# Patient Record
Sex: Male | Born: 1965 | Race: White | Hispanic: No | Marital: Married | Smoking: Never smoker
Health system: Southern US, Community
[De-identification: ages and names within clinical notes are randomized; demographics above are authoritative.]

## PROBLEM LIST (undated history)

## (undated) DIAGNOSIS — F419 Anxiety disorder, unspecified: Secondary | ICD-10-CM

## (undated) DIAGNOSIS — R030 Elevated blood-pressure reading, without diagnosis of hypertension: Secondary | ICD-10-CM

## (undated) DIAGNOSIS — I10 Essential (primary) hypertension: Secondary | ICD-10-CM

## (undated) DIAGNOSIS — M5416 Radiculopathy, lumbar region: Secondary | ICD-10-CM

## (undated) DIAGNOSIS — I639 Cerebral infarction, unspecified: Secondary | ICD-10-CM

## (undated) DIAGNOSIS — M48061 Spinal stenosis, lumbar region without neurogenic claudication: Secondary | ICD-10-CM

## (undated) DIAGNOSIS — F32A Depression, unspecified: Secondary | ICD-10-CM

## (undated) DIAGNOSIS — M549 Dorsalgia, unspecified: Secondary | ICD-10-CM

## (undated) HISTORY — DX: Depression, unspecified: F32.A

## (undated) HISTORY — DX: Cerebral infarction, unspecified: I63.9

## (undated) HISTORY — PX: OTHER SURGICAL HISTORY: SHX169

## (undated) HISTORY — DX: Anxiety disorder, unspecified: F41.9

## (undated) HISTORY — PX: HERNIA REPAIR: SHX51

## (undated) HISTORY — DX: Essential (primary) hypertension: I10

## (undated) HISTORY — PX: SPINE SURGERY: SHX786

---

## 2005-07-31 ENCOUNTER — Inpatient Hospital Stay: Payer: Self-pay | Admitting: Internal Medicine

## 2021-06-05 ENCOUNTER — Other Ambulatory Visit: Payer: Self-pay | Admitting: Chiropractic Medicine

## 2021-06-05 ENCOUNTER — Other Ambulatory Visit (HOSPITAL_COMMUNITY): Payer: Self-pay | Admitting: Chiropractic Medicine

## 2021-06-05 DIAGNOSIS — M5442 Lumbago with sciatica, left side: Secondary | ICD-10-CM

## 2021-06-05 DIAGNOSIS — M533 Sacrococcygeal disorders, not elsewhere classified: Secondary | ICD-10-CM

## 2021-06-05 DIAGNOSIS — M489 Spondylopathy, unspecified: Secondary | ICD-10-CM

## 2021-06-14 ENCOUNTER — Ambulatory Visit
Admission: RE | Admit: 2021-06-14 | Discharge: 2021-06-14 | Disposition: A | Discharge status: Home / Self Care | Payer: Commercial Managed Care - PPO | Source: Ambulatory Visit | Attending: Chiropractic Medicine | Admitting: Chiropractic Medicine

## 2021-06-14 ENCOUNTER — Other Ambulatory Visit: Payer: Self-pay

## 2021-06-14 DIAGNOSIS — M5442 Lumbago with sciatica, left side: Secondary | ICD-10-CM | POA: Insufficient documentation

## 2021-06-14 DIAGNOSIS — M489 Spondylopathy, unspecified: Secondary | ICD-10-CM | POA: Insufficient documentation

## 2021-06-14 DIAGNOSIS — M533 Sacrococcygeal disorders, not elsewhere classified: Secondary | ICD-10-CM | POA: Insufficient documentation

## 2021-07-05 ENCOUNTER — Other Ambulatory Visit: Payer: Self-pay | Admitting: Neurosurgery

## 2021-07-11 ENCOUNTER — Other Ambulatory Visit: Payer: Self-pay

## 2021-07-11 ENCOUNTER — Other Ambulatory Visit: Payer: Self-pay | Admitting: Neurosurgery

## 2021-07-11 ENCOUNTER — Ambulatory Visit
Admission: RE | Admit: 2021-07-11 | Discharge: 2021-07-11 | Disposition: A | Discharge status: Home / Self Care | Payer: Commercial Managed Care - PPO | Source: Ambulatory Visit | Attending: Neurosurgery | Admitting: Neurosurgery

## 2021-07-11 ENCOUNTER — Encounter
Admission: RE | Admit: 2021-07-11 | Discharge: 2021-07-11 | Disposition: A | Discharge status: Home / Self Care | Payer: Commercial Managed Care - PPO | Source: Ambulatory Visit | Attending: Neurosurgery | Admitting: Neurosurgery

## 2021-07-11 DIAGNOSIS — R292 Abnormal reflex: Secondary | ICD-10-CM | POA: Insufficient documentation

## 2021-07-11 DIAGNOSIS — R2689 Other abnormalities of gait and mobility: Secondary | ICD-10-CM | POA: Diagnosis present

## 2021-07-11 LAB — BASIC METABOLIC PANEL
Anion gap: 8 (ref 5–15)
BUN: 17 mg/dL (ref 6–20)
CO2: 25 mmol/L (ref 22–32)
Calcium: 9.8 mg/dL (ref 8.9–10.3)
Chloride: 104 mmol/L (ref 98–111)
Creatinine, Ser: 0.85 mg/dL (ref 0.61–1.24)
GFR, Estimated: >60 mL/min (ref >60)
Glucose, Bld: 150 mg/dL — ABNORMAL HIGH (ref 70–99)
Potassium: 4.2 mmol/L (ref 3.5–5.1)
Sodium: 137 mmol/L (ref 135–145)

## 2021-07-11 LAB — CBC
HCT: 47.4 % (ref 39.0–52.0)
Hemoglobin: 16.4 g/dL (ref 13.0–17.0)
MCH: 32 pg (ref 26.0–34.0)
MCHC: 34.6 g/dL (ref 30.0–36.0)
MCV: 92.4 fL (ref 80.0–100.0)
Platelets: 282 10*3/uL (ref 150–400)
RBC: 5.13 MIL/uL (ref 4.22–5.81)
RDW: 12.8 % (ref 11.5–15.5)
WBC: 6.2 10*3/uL (ref 4.0–10.5)
nRBC: 0 % (ref 0.0–0.2)

## 2021-07-11 LAB — URINALYSIS, ROUTINE W REFLEX MICROSCOPIC
Bacteria, UA: NONE SEEN
Bilirubin Urine: NEGATIVE
Glucose, UA: 50 mg/dL — AB
Ketones, ur: NEGATIVE mg/dL
Leukocytes,Ua: NEGATIVE
Nitrite: NEGATIVE
Protein, ur: NEGATIVE mg/dL
Specific Gravity, Urine: 1.013 (ref 1.005–1.030)
Squamous Epithelial / HPF: NONE SEEN (ref 0–5)
pH: 5 (ref 5.0–8.0)

## 2021-07-11 LAB — TYPE AND SCREEN
ABO/RH(D): A POS
Antibody Screen: NEGATIVE

## 2021-07-11 LAB — SURGICAL PCR SCREEN
MRSA, PCR: NEGATIVE
Staphylococcus aureus: NEGATIVE

## 2021-07-11 LAB — PROTIME-INR
INR: 1 (ref 0.8–1.2)
Prothrombin Time: 13.3 seconds (ref 11.4–15.2)

## 2021-07-11 LAB — APTT: aPTT: 29 seconds (ref 24–36)

## 2021-07-11 NOTE — Patient Instructions (Addendum)
?Your procedure is scheduled on: Wednesday July 18, 2021. ?Report to Day Surgery inside [facility] 2nd floor, stop by admissions desk before getting on elevator. ?To find out your arrival time please call [phone] between 1PM - 3PM on Tuesday July 17, 2021. ? ?Remember: Instructions that are not followed completely may result in serious medical risk,  ?up to and including death, or upon the discretion of your surgeon and anesthesiologist your  ?surgery may need to be rescheduled.  ? ?  _X__ 1. Do not eat food after midnight the night before your procedure. ?                No chewing gum or hard candies. You may drink clear liquids up to 2 hours ?                before you are scheduled to arrive for your surgery- DO not drink clear ?                liquids within 2 hours of the start of your surgery. ?                Clear Liquids include:  water, apple juice without pulp, clear Gatorade, G2 or  ?                Gatorade Zero (avoid Red/Purple/Blue), Black Coffee or Tea (Do not add ?                anything to coffee or tea). ? ?__X__2.  On the morning of surgery brush your teeth with toothpaste and water, you ?               may rinse your mouth with mouthwash if you wish.  Do not swallow any toothpaste or mouthwash. ?   ? _X__ 3.  No Alcohol for 24 hours before or after surgery. ? ? _X__ 4.  Do Not Smoke or use e-cigarettes For 24 Hours Prior to Your Surgery. ?                Do not use any chewable tobacco products for at least 6 hours prior to ?                Surgery. ? ?_X__  5.  Do not use any recreational drugs (marijuana, cocaine, heroin, ecstasy, MDMA or other) ?               For at least one week prior to your surgery.  Combination of these drugs with anesthesia ?               May have life threatening results. ? ?____  6.  Bring all medications with you on the day of surgery if instructed.  ? ?__X__  7.  Notify your doctor if there is any change in your medical  condition  ?    (cold, fever, infections). ?    ?Do not wear jewelry, make-up, hairpins, clips or nail polish. ?Do not wear lotions, powders, or perfumes. You may wear deodorant. ?Do not shave 48 hours prior to surgery. Men may shave face and neck. ?Do not bring valuables to the hospital.   ? ?[facility] is not responsible for any belongings or valuables. ? ?Contacts, dentures or bridgework may not be worn into surgery. ?Leave your suitcase in the car. After surgery it may be brought to your room. ?For patients admitted to the hospital, discharge time is determined  by your ?treatment team. ?  ?Patients discharged the day of surgery will not be allowed to drive home.   ?Make arrangements for someone to be with you for the first 24 hours of your ?Same Day Discharge. ? ?  ?Please read over the following fact sheets that you were given:  ? Spine Surgery   ? ? ?__X__ Take these medicines the morning of surgery with A SIP OF WATER:  ? ? 1. None ? 2.  ? 3.  ? 4. ? 5. ? 6. ? ?____ Fleet Enema (as directed)  ? ?__X__ Use CHG Soap (or wipes) as directed ? ?____ Use Benzoyl Peroxide Gel as instructed ? ?____ Use inhalers on the day of surgery ? ?____ Stop metformin 2 days prior to surgery   ? ?____ Take 1/2 of usual insulin dose the night before surgery. No insulin the morning ?         of surgery.  ? ?____ Call your PCP, cardiologist, or Pulmonologist if taking Coumadin/Plavix/aspirin and ask when to stop before your surgery.  ? ?__X__ One Week prior to surgery- Stop Anti-inflammatories such as Ibuprofen, Aleve, Advil, Motrin, meloxicam (MOBIC), diclofenac, etodolac, ketorolac, Toradol, Daypro, piroxicam, Goody's or BC powders. OK TO USE TYLENOL IF NEEDED ?  ?__X__ Stop supplements until after surgery.   ? ?____ Bring C-Pap to the hospital.  ? ? ?If you have any questions regarding your pre-procedure instructions,  ?Please call Pre-admit Testing at [phone] ?

## 2021-07-17 MED ORDER — CEFAZOLIN SODIUM-DEXTROSE 2-4 GM/100ML-% IV SOLN
2.0000 g | INTRAVENOUS | Status: AC
  Administered 2021-07-18: 2 g via INTRAVENOUS

## 2021-07-17 MED ORDER — FAMOTIDINE 20 MG PO TABS: 20.0000 mg | ORAL_TABLET | Freq: Once | ORAL | Status: AC

## 2021-07-17 MED ORDER — LACTATED RINGERS IV SOLN: INTRAVENOUS | Status: DC

## 2021-07-17 MED ORDER — CHLORHEXIDINE GLUCONATE 0.12 % MT SOLN: 15.0000 mL | Freq: Once | OROMUCOSAL | Status: AC

## 2021-07-17 MED ORDER — ORAL CARE MOUTH RINSE
15.0000 mL | Freq: Once | OROMUCOSAL | Status: AC
Start: 2021-07-17 — End: 2021-07-18

## 2021-07-17 NOTE — Progress Notes (Signed)
Pharmacy Antibiotic Note ? ?JORIEL DICHTER is a 56 y.o. male admitted on (Not on file) with surgical prophylaxis.  Pharmacy has been consulted for Cefazolin dosing. ? ?Plan: ?Cefazolin 2 gm IV X 1 60 min pre-op ordered for 3/29 @ 0500.  ? ?  ? ?No data recorded. ? ?Recent Labs  ?Lab 07/11/21 ?0910  ?WBC 6.2  ?CREATININE 0.85  ?  ?Estimated Creatinine Clearance: 114.9 mL/min (by C-G formula based on SCr of 0.85 mg/dL).   ? ?No Known Allergies ? ?Antimicrobials this admission: ?  >>  ?  >>  ? ?Dose adjustments this admission: ? ? ?Microbiology results: ? BCx:  ? UCx:   ? Sputum:   ? MRSA PCR:  ? ?Thank you for allowing pharmacy to be a part of this patient?s care. ? ?Floris Neuhaus D ?07/17/2021 10:10 PM ? ?

## 2021-07-18 ENCOUNTER — Other Ambulatory Visit: Payer: Self-pay

## 2021-07-18 ENCOUNTER — Encounter: Payer: Self-pay | Admitting: Neurosurgery

## 2021-07-18 ENCOUNTER — Ambulatory Visit: Payer: Commercial Managed Care - PPO | Admitting: Urgent Care

## 2021-07-18 ENCOUNTER — Encounter
Admission: RE | Disposition: A | Discharge status: Home / Self Care | Payer: Self-pay | Source: Home / Self Care | Attending: Internal Medicine

## 2021-07-18 ENCOUNTER — Observation Stay
Admission: RE | Admit: 2021-07-18 | Discharge: 2021-07-19 | Disposition: A | Discharge status: Home / Self Care | Payer: Commercial Managed Care - PPO | Attending: Internal Medicine | Admitting: Internal Medicine

## 2021-07-18 ENCOUNTER — Ambulatory Visit: Payer: Commercial Managed Care - PPO

## 2021-07-18 DIAGNOSIS — M48061 Spinal stenosis, lumbar region without neurogenic claudication: Secondary | ICD-10-CM | POA: Diagnosis not present

## 2021-07-18 DIAGNOSIS — M48062 Spinal stenosis, lumbar region with neurogenic claudication: Principal | ICD-10-CM | POA: Insufficient documentation

## 2021-07-18 DIAGNOSIS — I160 Hypertensive urgency: Secondary | ICD-10-CM

## 2021-07-18 DIAGNOSIS — M5416 Radiculopathy, lumbar region: Secondary | ICD-10-CM | POA: Diagnosis not present

## 2021-07-18 DIAGNOSIS — Z79899 Other long term (current) drug therapy: Secondary | ICD-10-CM | POA: Diagnosis not present

## 2021-07-18 HISTORY — DX: Spinal stenosis, lumbar region without neurogenic claudication: M48.061

## 2021-07-18 HISTORY — DX: Dorsalgia, unspecified: M54.9

## 2021-07-18 HISTORY — DX: Radiculopathy, lumbar region: M54.16

## 2021-07-18 HISTORY — PX: LUMBAR LAMINECTOMY/DECOMPRESSION MICRODISCECTOMY: SHX5026

## 2021-07-18 LAB — CBC
HCT: 46.1 % (ref 39.0–52.0)
Hemoglobin: 15.8 g/dL (ref 13.0–17.0)
MCH: 31.9 pg (ref 26.0–34.0)
MCHC: 34.3 g/dL (ref 30.0–36.0)
MCV: 92.9 fL (ref 80.0–100.0)
Platelets: 251 10*3/uL (ref 150–400)
RBC: 4.96 MIL/uL (ref 4.22–5.81)
RDW: 12.9 % (ref 11.5–15.5)
WBC: 9.4 10*3/uL (ref 4.0–10.5)
nRBC: 0 % (ref 0.0–0.2)

## 2021-07-18 LAB — BASIC METABOLIC PANEL
Anion gap: 11 (ref 5–15)
BUN: 19 mg/dL (ref 6–20)
CO2: 25 mmol/L (ref 22–32)
Calcium: 8.4 mg/dL — ABNORMAL LOW (ref 8.9–10.3)
Chloride: 99 mmol/L (ref 98–111)
Creatinine, Ser: 1.12 mg/dL (ref 0.61–1.24)
GFR, Estimated: >60 mL/min (ref >60)
Glucose, Bld: 232 mg/dL — ABNORMAL HIGH (ref 70–99)
Potassium: 3.6 mmol/L (ref 3.5–5.1)
Sodium: 135 mmol/L (ref 135–145)

## 2021-07-18 LAB — TROPONIN I (HIGH SENSITIVITY)
Troponin I (High Sensitivity): 5 ng/L (ref <18)
Troponin I (High Sensitivity): 5 ng/L (ref <18)

## 2021-07-18 LAB — ABO/RH: ABO/RH(D): A POS

## 2021-07-18 LAB — URINE DRUG SCREEN, QUALITATIVE (ARMC ONLY)
Amphetamines, Ur Screen: NOT DETECTED
Barbiturates, Ur Screen: NOT DETECTED
Benzodiazepine, Ur Scrn: POSITIVE — AB
Cannabinoid 50 Ng, Ur ~~LOC~~: NOT DETECTED
Cocaine Metabolite,Ur ~~LOC~~: NOT DETECTED
MDMA (Ecstasy)Ur Screen: NOT DETECTED
Methadone Scn, Ur: NOT DETECTED
Opiate, Ur Screen: NOT DETECTED
Phencyclidine (PCP) Ur S: NOT DETECTED
Tricyclic, Ur Screen: NOT DETECTED

## 2021-07-18 LAB — HIV ANTIBODY (ROUTINE TESTING W REFLEX): HIV Screen 4th Generation wRfx: NONREACTIVE

## 2021-07-18 SURGERY — LUMBAR LAMINECTOMY/DECOMPRESSION MICRODISCECTOMY 2 LEVELS
Anesthesia: General | Site: Back

## 2021-07-18 MED ORDER — SODIUM CHLORIDE FLUSH 0.9 % IV SOLN
INTRAVENOUS | Status: AC
  Filled 2021-07-18: qty 20

## 2021-07-18 MED ORDER — METOPROLOL TARTRATE 5 MG/5ML IV SOLN
2.0000 mg | Freq: Once | INTRAVENOUS | Status: AC
  Administered 2021-07-18: 2 mg via INTRAVENOUS

## 2021-07-18 MED ORDER — SUCCINYLCHOLINE CHLORIDE 200 MG/10ML IV SOSY
PREFILLED_SYRINGE | INTRAVENOUS | Status: DC | PRN
Start: 2021-07-18 — End: 2021-07-18
  Administered 2021-07-18: 100 mg via INTRAVENOUS

## 2021-07-18 MED ORDER — ONDANSETRON HCL 4 MG/2ML IJ SOLN
INTRAMUSCULAR | Status: DC | PRN
  Administered 2021-07-18: 4 mg via INTRAVENOUS

## 2021-07-18 MED ORDER — METOPROLOL TARTRATE 5 MG/5ML IV SOLN
INTRAVENOUS | Status: AC
  Administered 2021-07-18: 2 mg via INTRAVENOUS
  Filled 2021-07-18: qty 5

## 2021-07-18 MED ORDER — AMLODIPINE BESYLATE 5 MG PO TABS
5.0000 mg | ORAL_TABLET | Freq: Every day | ORAL | Status: DC
Start: 2021-07-18 — End: 2021-07-19
  Filled 2021-07-18: qty 1

## 2021-07-18 MED ORDER — PROPOFOL 1000 MG/100ML IV EMUL
INTRAVENOUS | Status: AC
  Filled 2021-07-18: qty 100

## 2021-07-18 MED ORDER — OXYCODONE HCL 5 MG PO TABS
5.0000 mg | ORAL_TABLET | Freq: Three times a day (TID) | ORAL | Status: DC | PRN
  Administered 2021-07-19: 5 mg via ORAL
  Filled 2021-07-18: qty 1

## 2021-07-18 MED ORDER — REMIFENTANIL HCL 1 MG IV SOLR
INTRAVENOUS | Status: AC
  Filled 2021-07-18: qty 1000

## 2021-07-18 MED ORDER — ACETAMINOPHEN 10 MG/ML IV SOLN
INTRAVENOUS | Status: AC
  Filled 2021-07-18: qty 100

## 2021-07-18 MED ORDER — PHENYLEPHRINE HCL-NACL 20-0.9 MG/250ML-% IV SOLN
INTRAVENOUS | Status: DC | PRN
  Administered 2021-07-18: 20 ug/min via INTRAVENOUS

## 2021-07-18 MED ORDER — FENTANYL CITRATE (PF) 100 MCG/2ML IJ SOLN
INTRAMUSCULAR | Status: DC | PRN
  Administered 2021-07-18: 100 ug via INTRAVENOUS

## 2021-07-18 MED ORDER — NITROGLYCERIN 2 % TD OINT
TOPICAL_OINTMENT | TRANSDERMAL | Status: AC
  Administered 2021-07-18: 1 [in_us] via TOPICAL
  Filled 2021-07-18: qty 1

## 2021-07-18 MED ORDER — PHENYLEPHRINE 40 MCG/ML (10ML) SYRINGE FOR IV PUSH (FOR BLOOD PRESSURE SUPPORT)
PREFILLED_SYRINGE | INTRAVENOUS | Status: DC | PRN
  Administered 2021-07-18: 40 ug via INTRAVENOUS

## 2021-07-18 MED ORDER — LIDOCAINE HCL (PF) 2 % IJ SOLN
INTRAMUSCULAR | Status: AC
  Filled 2021-07-18: qty 5

## 2021-07-18 MED ORDER — PHENYLEPHRINE HCL (PRESSORS) 10 MG/ML IV SOLN
INTRAVENOUS | Status: AC
  Filled 2021-07-18: qty 1

## 2021-07-18 MED ORDER — PROPOFOL 10 MG/ML IV BOLUS
INTRAVENOUS | Status: DC | PRN
  Administered 2021-07-18: 50 mg via INTRAVENOUS
  Administered 2021-07-18: 150 mg via INTRAVENOUS

## 2021-07-18 MED ORDER — SODIUM CHLORIDE (PF) 0.9 % IJ SOLN
INTRAMUSCULAR | Status: DC | PRN
  Administered 2021-07-18: 60 mL via INTRAMUSCULAR

## 2021-07-18 MED ORDER — REMIFENTANIL HCL 1 MG IV SOLR
INTRAVENOUS | Status: DC | PRN
Start: 2021-07-18 — End: 2021-07-18
  Administered 2021-07-18: .2 ug/kg/min via INTRAVENOUS

## 2021-07-18 MED ORDER — FAMOTIDINE 20 MG PO TABS
ORAL_TABLET | ORAL | Status: AC
  Administered 2021-07-18: 20 mg via ORAL
  Filled 2021-07-18: qty 1

## 2021-07-18 MED ORDER — MORPHINE SULFATE (PF) 2 MG/ML IV SOLN: 2.0000 mg | INTRAVENOUS | Status: DC | PRN

## 2021-07-18 MED ORDER — METOPROLOL TARTRATE 5 MG/5ML IV SOLN: 2.0000 mg | Freq: Once | INTRAVENOUS | Status: AC

## 2021-07-18 MED ORDER — BUPIVACAINE LIPOSOME 1.3 % IJ SUSP
INTRAMUSCULAR | Status: AC
  Filled 2021-07-18: qty 20

## 2021-07-18 MED ORDER — HYDROMORPHONE HCL 1 MG/ML IJ SOLN
INTRAMUSCULAR | Status: AC
Start: 2021-07-18 — End: ?
  Filled 2021-07-18: qty 1

## 2021-07-18 MED ORDER — IBUPROFEN 400 MG PO TABS
400.0000 mg | ORAL_TABLET | Freq: Two times a day (BID) | ORAL | Status: DC | PRN
  Administered 2021-07-18 – 2021-07-19 (×3): 400 mg via ORAL
  Filled 2021-07-18 (×3): qty 1

## 2021-07-18 MED ORDER — BUPIVACAINE-EPINEPHRINE (PF) 0.5% -1:200000 IJ SOLN
INTRAMUSCULAR | Status: DC | PRN
  Administered 2021-07-18: 7 mL

## 2021-07-18 MED ORDER — CHLORHEXIDINE GLUCONATE 0.12 % MT SOLN
OROMUCOSAL | Status: AC
  Administered 2021-07-18: 15 mL via OROMUCOSAL
  Filled 2021-07-18: qty 15

## 2021-07-18 MED ORDER — ONDANSETRON HCL 4 MG/2ML IJ SOLN
4.0000 mg | Freq: Three times a day (TID) | INTRAMUSCULAR | Status: DC | PRN
Start: 2021-07-18 — End: 2021-07-19

## 2021-07-18 MED ORDER — SURGIFLO WITH THROMBIN (HEMOSTATIC MATRIX KIT) OPTIME
TOPICAL | Status: DC | PRN
Start: 2021-07-18 — End: 2021-07-18
  Administered 2021-07-18: 1 via TOPICAL

## 2021-07-18 MED ORDER — MIDAZOLAM HCL 2 MG/2ML IJ SOLN
INTRAMUSCULAR | Status: DC | PRN
  Administered 2021-07-18: 2 mg via INTRAVENOUS

## 2021-07-18 MED ORDER — SENNA 8.6 MG PO TABS
1.0000 | ORAL_TABLET | Freq: Every day | ORAL | 0 refills | Status: DC | PRN
Start: 2021-07-18 — End: 2021-10-30

## 2021-07-18 MED ORDER — SODIUM CHLORIDE (PF) 0.9 % IJ SOLN
INTRAMUSCULAR | Status: AC
  Filled 2021-07-18: qty 20

## 2021-07-18 MED ORDER — HYDROMORPHONE HCL 1 MG/ML IJ SOLN
INTRAMUSCULAR | Status: DC | PRN
  Administered 2021-07-18 (×2): .5 mg via INTRAVENOUS

## 2021-07-18 MED ORDER — ONDANSETRON HCL 4 MG/2ML IJ SOLN: 4.0000 mg | Freq: Once | INTRAMUSCULAR | Status: DC | PRN

## 2021-07-18 MED ORDER — MIDAZOLAM HCL 2 MG/2ML IJ SOLN
INTRAMUSCULAR | Status: AC
  Filled 2021-07-18: qty 2

## 2021-07-18 MED ORDER — 0.9 % SODIUM CHLORIDE (POUR BTL) OPTIME
TOPICAL | Status: DC | PRN
  Administered 2021-07-18: 500 mL

## 2021-07-18 MED ORDER — OXYCODONE HCL 5 MG PO TABS: 5.0000 mg | ORAL_TABLET | ORAL | 0 refills | Status: AC | PRN

## 2021-07-18 MED ORDER — ROCURONIUM BROMIDE 10 MG/ML (PF) SYRINGE
PREFILLED_SYRINGE | INTRAVENOUS | Status: AC
  Filled 2021-07-18: qty 10

## 2021-07-18 MED ORDER — ACETAMINOPHEN 10 MG/ML IV SOLN
INTRAVENOUS | Status: DC | PRN
  Administered 2021-07-18: 1000 mg via INTRAVENOUS

## 2021-07-18 MED ORDER — METHYLPREDNISOLONE ACETATE 40 MG/ML IJ SUSP
INTRAMUSCULAR | Status: AC
  Filled 2021-07-18: qty 1

## 2021-07-18 MED ORDER — AMLODIPINE BESYLATE 10 MG PO TABS: 10.0000 mg | ORAL_TABLET | Freq: Every day | ORAL | Status: DC

## 2021-07-18 MED ORDER — BUPIVACAINE HCL (PF) 0.5 % IJ SOLN
INTRAMUSCULAR | Status: AC
  Filled 2021-07-18: qty 30

## 2021-07-18 MED ORDER — OXYCODONE-ACETAMINOPHEN 5-325 MG PO TABS: 1.0000 | ORAL_TABLET | ORAL | Status: DC | PRN

## 2021-07-18 MED ORDER — BUPIVACAINE-EPINEPHRINE (PF) 0.5% -1:200000 IJ SOLN
INTRAMUSCULAR | Status: AC
  Filled 2021-07-18: qty 30

## 2021-07-18 MED ORDER — ASPIRIN EC 81 MG PO TBEC
81.0000 mg | DELAYED_RELEASE_TABLET | Freq: Every day | ORAL | Status: DC
  Administered 2021-07-19: 81 mg via ORAL
  Filled 2021-07-18 (×2): qty 1

## 2021-07-18 MED ORDER — METHOCARBAMOL 500 MG PO TABS
500.0000 mg | ORAL_TABLET | Freq: Four times a day (QID) | ORAL | 0 refills | Status: DC
Start: 2021-07-18 — End: 2021-10-31

## 2021-07-18 MED ORDER — LABETALOL HCL 5 MG/ML IV SOLN
10.0000 mg | Freq: Once | INTRAVENOUS | Status: AC
  Administered 2021-07-18: 10 mg via INTRAVENOUS

## 2021-07-18 MED ORDER — METHYLPREDNISOLONE ACETATE 40 MG/ML IJ SUSP
INTRAMUSCULAR | Status: DC | PRN
Start: 2021-07-18 — End: 2021-07-18
  Administered 2021-07-18: 40 mg

## 2021-07-18 MED ORDER — EPHEDRINE SULFATE (PRESSORS) 50 MG/ML IJ SOLN
INTRAMUSCULAR | Status: DC | PRN
  Administered 2021-07-18: 5 mg via INTRAVENOUS
  Administered 2021-07-18: 10 mg via INTRAVENOUS

## 2021-07-18 MED ORDER — CEFAZOLIN SODIUM-DEXTROSE 2-4 GM/100ML-% IV SOLN
INTRAVENOUS | Status: AC
  Filled 2021-07-18: qty 100

## 2021-07-18 MED ORDER — FENTANYL CITRATE (PF) 100 MCG/2ML IJ SOLN: 25.0000 ug | INTRAMUSCULAR | Status: DC | PRN

## 2021-07-18 MED ORDER — DEXAMETHASONE SODIUM PHOSPHATE 10 MG/ML IJ SOLN
INTRAMUSCULAR | Status: DC | PRN
  Administered 2021-07-18: 10 mg via INTRAVENOUS

## 2021-07-18 MED ORDER — LABETALOL HCL 5 MG/ML IV SOLN
INTRAVENOUS | Status: AC
  Administered 2021-07-18: 5 mg via INTRAVENOUS
  Filled 2021-07-18: qty 4

## 2021-07-18 MED ORDER — FENTANYL CITRATE (PF) 100 MCG/2ML IJ SOLN
INTRAMUSCULAR | Status: AC
  Filled 2021-07-18: qty 2

## 2021-07-18 MED ORDER — LIDOCAINE HCL 4 % EX SOLN
CUTANEOUS | Status: DC | PRN
  Administered 2021-07-18: 4 mL via TOPICAL

## 2021-07-18 MED ORDER — HYDRALAZINE HCL 20 MG/ML IJ SOLN
5.0000 mg | INTRAMUSCULAR | Status: DC | PRN
  Filled 2021-07-18: qty 0.25

## 2021-07-18 MED ORDER — DEXAMETHASONE SODIUM PHOSPHATE 10 MG/ML IJ SOLN
INTRAMUSCULAR | Status: AC
  Filled 2021-07-18: qty 1

## 2021-07-18 MED ORDER — ONDANSETRON HCL 4 MG/2ML IJ SOLN
INTRAMUSCULAR | Status: AC
  Filled 2021-07-18: qty 2

## 2021-07-18 MED ORDER — LABETALOL HCL 5 MG/ML IV SOLN
5.0000 mg | INTRAVENOUS | Status: AC | PRN
  Administered 2021-07-18: 5 mg via INTRAVENOUS

## 2021-07-18 MED ORDER — NITROGLYCERIN 2 % TD OINT
1.0000 [in_us] | TOPICAL_OINTMENT | Freq: Once | TRANSDERMAL | Status: AC
Start: 2021-07-18 — End: 2021-07-18

## 2021-07-18 MED ORDER — ACETAMINOPHEN 325 MG PO TABS: 650.0000 mg | ORAL_TABLET | Freq: Four times a day (QID) | ORAL | Status: DC | PRN

## 2021-07-18 MED ORDER — NITROGLYCERIN 0.4 MG SL SUBL
0.4000 mg | SUBLINGUAL_TABLET | SUBLINGUAL | Status: DC | PRN
Start: 2021-07-18 — End: 2021-07-19

## 2021-07-18 MED ORDER — METOPROLOL TARTRATE 5 MG/5ML IV SOLN: 2.0000 mg | INTRAVENOUS | Status: DC | PRN

## 2021-07-18 MED ORDER — METOPROLOL TARTRATE 5 MG/5ML IV SOLN
INTRAVENOUS | Status: AC
  Filled 2021-07-18: qty 5

## 2021-07-18 SURGICAL SUPPLY — 55 items
ADH SKN CLS APL DERMABOND .7 (GAUZE/BANDAGES/DRESSINGS) ×1
AGENT HMST KT MTR STRL THRMB (HEMOSTASIS) ×1
APL PRP STRL LF DISP 70% ISPRP (MISCELLANEOUS) ×1
BUR NEURO DRILL SOFT 3.0X3.8M (BURR) ×3 IMPLANT
CHLORAPREP W/TINT 26 (MISCELLANEOUS) ×3 IMPLANT
CNTNR SPEC 2.5X3XGRAD LEK (MISCELLANEOUS) ×1
CONT SPEC 4OZ STER OR WHT (MISCELLANEOUS) ×1
CONT SPEC 4OZ STRL OR WHT (MISCELLANEOUS) ×1
CONTAINER SPEC 2.5X3XGRAD LEK (MISCELLANEOUS) ×2 IMPLANT
COUNTER NEEDLE 20/40 LG (NEEDLE) ×3 IMPLANT
CUP MEDICINE 2OZ PLAST GRAD ST (MISCELLANEOUS) ×3 IMPLANT
DERMABOND ADVANCED (GAUZE/BANDAGES/DRESSINGS) ×1
DERMABOND ADVANCED .7 DNX12 (GAUZE/BANDAGES/DRESSINGS) ×2 IMPLANT
DRAPE C ARM PK CFD 31 SPINE (DRAPES) ×3 IMPLANT
DRAPE LAPAROTOMY 100X77 ABD (DRAPES) ×3 IMPLANT
DRAPE MICROSCOPE SPINE 48X150 (DRAPES) ×3 IMPLANT
DRAPE SURG 17X11 SM STRL (DRAPES) ×9 IMPLANT
ELECT CAUTERY BLADE TIP 2.5 (TIP) ×2
ELECT EZSTD 165MM 6.5IN (MISCELLANEOUS) ×2
ELECT REM PT RETURN 9FT ADLT (ELECTROSURGICAL) ×2
ELECTRODE CAUTERY BLDE TIP 2.5 (TIP) ×2 IMPLANT
ELECTRODE EZSTD 165MM 6.5IN (MISCELLANEOUS) ×2 IMPLANT
ELECTRODE REM PT RTRN 9FT ADLT (ELECTROSURGICAL) ×2 IMPLANT
GAUZE 4X4 16PLY ~~LOC~~+RFID DBL (SPONGE) ×3 IMPLANT
GLOVE SURG SYN 6.5 ES PF (GLOVE) ×2 IMPLANT
GLOVE SURG SYN 6.5 PF PI (GLOVE) ×2 IMPLANT
GLOVE SURG SYN 8.5  E (GLOVE) ×6
GLOVE SURG SYN 8.5 E (GLOVE) ×3 IMPLANT
GLOVE SURG SYN 8.5 PF PI (GLOVE) ×6 IMPLANT
GLOVE SURG UNDER POLY LF SZ6.5 (GLOVE) ×3 IMPLANT
GOWN SRG LRG LVL 4 IMPRV REINF (GOWNS) ×2 IMPLANT
GOWN SRG XL LVL 3 NONREINFORCE (GOWNS) ×2 IMPLANT
GOWN STRL NON-REIN TWL XL LVL3 (GOWNS) ×2
GOWN STRL REIN LRG LVL4 (GOWNS) ×2
GRADUATE 1200CC STRL 31836 (MISCELLANEOUS) ×3 IMPLANT
GRAFT DURAGEN MATRIX 1WX1L (Tissue) IMPLANT
KIT SPINAL PRONEVIEW (KITS) ×3 IMPLANT
MANIFOLD NEPTUNE II (INSTRUMENTS) ×3 IMPLANT
MARKER SKIN DUAL TIP RULER LAB (MISCELLANEOUS) ×6 IMPLANT
NDL SAFETY ECLIPSE 18X1.5 (NEEDLE) ×2 IMPLANT
NEEDLE HYPO 18GX1.5 SHARP (NEEDLE) ×2
NEEDLE HYPO 22GX1.5 SAFETY (NEEDLE) ×3 IMPLANT
NS IRRIG 1000ML POUR BTL (IV SOLUTION) ×2 IMPLANT
NS IRRIG 500ML POUR BTL (IV SOLUTION) ×1 IMPLANT
PACK LAMINECTOMY NEURO (CUSTOM PROCEDURE TRAY) ×3 IMPLANT
SURGIFLO W/THROMBIN 8M KIT (HEMOSTASIS) ×3 IMPLANT
SUT DVC VLOC 3-0 CL 6 P-12 (SUTURE) ×3 IMPLANT
SUT VIC AB 0 CT1 27 (SUTURE) ×2
SUT VIC AB 0 CT1 27XCR 8 STRN (SUTURE) ×2 IMPLANT
SUT VIC AB 2-0 CT1 18 (SUTURE) ×3 IMPLANT
SYR 30ML LL (SYRINGE) ×6 IMPLANT
SYR 3ML LL SCALE MARK (SYRINGE) ×3 IMPLANT
TOWEL OR 17X26 4PK STRL BLUE (TOWEL DISPOSABLE) ×9 IMPLANT
TUBING CONNECTING 10 (TUBING) ×3 IMPLANT
WATER STERILE IRR 500ML POUR (IV SOLUTION) ×2 IMPLANT

## 2021-07-18 NOTE — Anesthesia Procedure Notes (Signed)
Procedure Name: Intubation ?Date/Time: 07/18/2021 7:21 AM ?Performed by: Rolla Plate, CRNA ?Pre-anesthesia Checklist: Patient identified, Patient being monitored, Timeout performed, Emergency Drugs available and Suction available ?Patient Re-evaluated:Patient Re-evaluated prior to induction ?Oxygen Delivery Method: Circle system utilized ?Preoxygenation: Pre-oxygenation with 100% oxygen ?Induction Type: IV induction ?Ventilation: Mask ventilation without difficulty ?Laryngoscope Size: McGraph and 4 ?Grade View: Grade I ?Tube type: Oral ?Tube size: 7.5 mm ?Number of attempts: 1 ?Airway Equipment and Method: Stylet ?Placement Confirmation: ETT inserted through vocal cords under direct vision, positive ETCO2 and breath sounds checked- equal and bilateral ?Secured at: 22 cm ?Tube secured with: Tape ?Dental Injury: Teeth and Oropharynx as per pre-operative assessment  ?Comments: Placed by Lucita Ferrara, SRNA ? ? ? ? ?

## 2021-07-18 NOTE — Progress Notes (Signed)
Dr. Clyde Lundborg made aware that the patient is refusing his Amlodipine and Aspirin.  ?

## 2021-07-18 NOTE — Progress Notes (Signed)
Notified Dr. Andree Elk of patient's BP of 164/108, pre op-194/107 and ST depression noted on ECG monitor. Received order for 12-Lead EKG and Labetalol 5mg  x2.  ?

## 2021-07-18 NOTE — H&P (Signed)
?History and Physical  ? ? ?Erik Wallace ION:[id number]:[id number] DOB: 01/16/1966 DOA: 07/18/2021 ? ?Referring MD/NP/PA:  ? ?PCP: Pcp, No  ? ?Patient coming from:  The patient is coming from home.  At baseline, pt is independent for most of ADL.       ? ?Chief Complaint: back pain and elevated blood pressure ? ?HPI: Erik Wallace is a 56 y.o. male with medical history significant of chronic back pain due to lumbar radiculopathy and spinal stenosis at L4-L5 level, who presents with back pain and elevated blood pressure. ? ?Patient had underwent scheduled procedure for his back pain by Dr. Marcell BarlowYarborough of neurosurgery. Pt is s/p of left L5-S1 discectomy and 52 n on today 07/18/21. After the surgery, pt was found to have significantly elevated blood pressure at 194/107, which improved to SBP 140-160s after giving 2 doses of labetalol 5 mg in PACU.  Patient is asymptomatic.  He does not have chest pain, shortness breath.  No fever or chills.  No leg edema.  Patient stated he had some diarrhea recently which he attributed to ibuprofen use, but states that his diarrhea has resolved.  Currently no nausea, vomiting, abdominal pain or diarrhea.  No symptoms of UTI.  Patient denies history of hypertension.  Patient has mild left foot toe numbness currently. ? ? ?Data Reviewed pt was found to have WBC 9.4, hemoglobin 15.8, troponin level 5, pending BMP. UDS is positive for benzo. Temperature 97.3, heart rate 107, 90, RR 19,Oxygen saturation 99% on room air.  Patient is placed on telemetry bed for observation. ? ?EKG: I have personally reviewed.  sinus rhythm, QTc 428, T wave inversion in lateral leads and V4-V6, mild ST elevation in V1-V2. ? ? ?Review of Systems:  ? ?General: no fevers, chills, no body weight gain, fatigue ?HEENT: no blurry vision, hearing changes or sore throat ?Respiratory: no dyspnea, coughing, wheezing ?CV: no chest pain, no palpitations ?GI: no nausea, vomiting, abdominal pain, diarrhea, constipation ?GU: no  dysuria, burning on urination, increased urinary frequency, hematuria  ?Ext: no leg edema ?Neuro: no unilateral weakness, numbness, or tingling, no vision change or hearing loss. ?Skin: no rash, no skin tear. ?MSK: has back pain ?Heme: No easy bruising.  ?Travel history: No recent long distant travel. ? ? ?Allergy: No Known Allergies ? ?Past Medical History:  ?Diagnosis Date  ? Back pain   ? Lumbar radiculopathy   ? Spinal stenosis at L4-L5 level   ? ? ?Past Surgical History:  ?Procedure Laterality Date  ? HERNIA REPAIR Left   ? inguinal hernia  ? L4-5 decompressio    ? Left L5-S1 discectomy    ? LUMBAR LAMINECTOMY/DECOMPRESSION MICRODISCECTOMY N/A 07/18/2021  ? Procedure: L4-5 POSTERIOR SPINAL DECOMPRESSION, LEFT L5-S1  MICRODISCECTOMY;  Surgeon: Venetia NightYarbrough, [place], MD;  Location: [facility] ORS;  Service: Neurosurgery;  Laterality: N/A;  ? ? ?Social History:  reports that he has never smoked. He has never used smokeless tobacco. He reports current alcohol use of about 3.0 standard drinks per week. He reports that he does not use drugs. ? ?Family History:  ?Family History  ?Problem Relation Age of Onset  ? Heart disease Mother   ?  ? ?Prior to Admission medications   ?Medication Sig Start Date End Date Taking? Authorizing Provider  ?ibuprofen (ADVIL) 200 MG tablet Take 800 mg by mouth 2 (two) times daily as needed for moderate pain.   Yes [provider]  ?methocarbamol (ROBAXIN) 500 MG tablet Take 1 tablet (500 mg total) by  mouth 4 (four) times daily. 07/18/21  Yes Susanne Borders, PA  ?oxyCODONE (ROXICODONE) 5 MG immediate release tablet Take 1 tablet (5 mg total) by mouth every 4 (four) hours as needed for up to 5 days for severe pain. 07/18/21 07/23/21 Yes Susanne Borders, PA  ?senna (SENOKOT) 8.6 MG TABS tablet Take 1 tablet (8.6 mg total) by mouth daily as needed for mild constipation. 07/18/21  Yes Susanne Borders, PA  ?acetaminophen (TYLENOL) 500 MG tablet Take 500 mg by mouth every 6 (six) hours as  needed.    [provider]  ?gabapentin (NEURONTIN) 300 MG capsule Take 300 mg by mouth at bedtime. ?Patient not taking: Reported on 07/18/2021 07/05/21   [provider]  ? ? ?Physical Exam: ?Vitals:  ? 07/18/21 1215 07/18/21 1230 07/18/21 1240 07/18/21 1709  ?BP: (!) 143/95 (!) 147/104 (!) 137/97 122/83  ?Pulse: 92  97 95  ?Resp: 15 (!) 22  16  ?Temp:   98.1 ?F (36.7 ?C) 98 ?F (36.7 ?C)  ?TempSrc:   Oral   ?SpO2: 96%  95% 96%  ?Weight:      ?Height:      ? ?General: Not in acute distress ?HEENT: ?      Eyes: PERRL, EOMI, no scleral icterus. ?      ENT: No discharge from the ears and nose, no pharynx injection, no tonsillar enlargement.  ?      Neck: No JVD, no bruit, no mass felt. ?Heme: No neck lymph node enlargement. ?Cardiac: S1/S2, RRR, No murmurs, No gallops or rubs. ?Respiratory: No rales, wheezing, rhonchi or rubs. ?GI: Soft, nondistended, nontender, no rebound pain, no organomegaly, BS present. ?GU: No hematuria ?Ext: No pitting leg edema bilaterally. 1+DP/PT pulse bilaterally. ?Musculoskeletal: s/p of L spin surgery ?Skin: No rashes.  ?Neuro: Alert, oriented X3, cranial nerves II-XII grossly intact, moves all extremities normally.  ?Psych: Patient is not psychotic, no suicidal or hemocidal ideation. ? ?Labs on Admission: I have personally reviewed following labs and imaging studies ? ?CBC: ?Recent Labs  ?Lab 07/18/21 ?1109  ?WBC 9.4  ?HGB 15.8  ?HCT 46.1  ?MCV 92.9  ?PLT 251  ? ?Basic Metabolic Panel: ?Recent Labs  ?Lab 07/18/21 ?1355  ?NA 135  ?K 3.6  ?CL 99  ?CO2 25  ?GLUCOSE 232*  ?BUN 19  ?CREATININE 1.12  ?CALCIUM 8.4*  ? ?GFR: ?Estimated Creatinine Clearance: 87.2 mL/min (by C-G formula based on SCr of 1.12 mg/dL). ?Liver Function Tests: ?No results for input(s): AST, ALT, ALKPHOS, BILITOT, PROT, ALBUMIN in the last 168 hours. ?No results for input(s): LIPASE, AMYLASE in the last 168 hours. ?No results for input(s): AMMONIA in the last 168 hours. ?Coagulation Profile: ?No results for  input(s): INR, PROTIME in the last 168 hours. ?Cardiac Enzymes: ?No results for input(s): CKTOTAL, CKMB, CKMBINDEX, TROPONINI in the last 168 hours. ?BNP (last 3 results) ?No results for input(s): PROBNP in the last 8760 hours. ?HbA1C: ?No results for input(s): HGBA1C in the last 72 hours. ?CBG: ?No results for input(s): GLUCAP in the last 168 hours. ?Lipid Profile: ?No results for input(s): CHOL, HDL, LDLCALC, TRIG, CHOLHDL, LDLDIRECT in the last 72 hours. ?Thyroid Function Tests: ?No results for input(s): TSH, T4TOTAL, FREET4, T3FREE, THYROIDAB in the last 72 hours. ?Anemia Panel: ?No results for input(s): VITAMINB12, FOLATE, FERRITIN, TIBC, IRON, RETICCTPCT in the last 72 hours. ?Urine analysis: ?   ?Component Value Date/Time  ? COLORURINE YELLOW (A) 07/11/2021 0910  ? APPEARANCEUR CLEAR (A) 07/11/2021 0910  ?  LABSPEC 1.013 07/11/2021 0910  ? PHURINE 5.0 07/11/2021 0910  ? GLUCOSEU 50 (A) 07/11/2021 0910  ? HGBUR SMALL (A) 07/11/2021 0910  ? BILIRUBINUR NEGATIVE 07/11/2021 0910  ? KETONESUR NEGATIVE 07/11/2021 0910  ? PROTEINUR NEGATIVE 07/11/2021 0910  ? NITRITE NEGATIVE 07/11/2021 0910  ? LEUKOCYTESUR NEGATIVE 07/11/2021 0910  ? ?Sepsis Labs: ?@LABRCNTIP (procalcitonin:4,lacticidven:4) ?) ?Recent Results (from the past 240 hour(s))  ?Surgical pcr screen     Status: None  ? Collection Time: 07/11/21  9:10 AM  ? Specimen: Nasal Mucosa; Nasal Swab  ?Result Value Ref Range Status  ? MRSA, PCR NEGATIVE NEGATIVE Final  ? Staphylococcus aureus NEGATIVE NEGATIVE Final  ?  Comment: (NOTE) ?The Xpert SA Assay (FDA approved for NASAL specimens in patients 21 ?years of age and older), is one component of a comprehensive ?surveillance program. It is not intended to diagnose infection nor to ?guide or monitor treatment. ?Performed at [facility], [address] [place], ?[place] ?  ?  ? ?Radiological Exams on Admission: ?DG Lumbar Spine 2-3 Views ? ?Result Date: 07/18/2021 ?CLINICAL DATA:  L4-L5 and L5-S1  microdiscectomy EXAM: LUMBAR SPINE - 2-3 VIEW COMPARISON:  Lumbar spine MRI 06/14/2021 FINDINGS: 4 lateral C-arm fluoroscopic images were obtained intraoperatively and submitted for post operative interp

## 2021-07-18 NOTE — Discharge Instructions (Addendum)
?Your surgeon has performed an operation on your lumbar spine (low back) to relieve pressure on one or more nerves. Many times, patients feel better immediately after surgery and can ?overdo it.? Even if you feel well, it is important that you follow these activity guidelines. If you do not let your back heal properly from the surgery, you can increase the chance of a disc herniation and/or return of your symptoms. The following are instructions to help in your recovery once you have been discharged from the hospital. ? ?Activity  ?  ?No bending, lifting, or twisting (?BLT?). Avoid lifting objects heavier than 10 pounds (gallon milk jug).  Where possible, avoid household activities that involve lifting, bending, pushing, or pulling such as laundry, vacuuming, grocery shopping, and childcare. Try to arrange for help from friends and family for these activities while your back heals. ? ?Increase physical activity slowly as tolerated.  Taking short walks is encouraged, but avoid strenuous exercise. Do not jog, run, bicycle, lift weights, or participate in any other exercises unless specifically allowed by your doctor. Avoid prolonged sitting, including car rides. ? ?Talk to your doctor before resuming sexual activity. ? ?You should not drive until cleared by your doctor. ? ?Until released by your doctor, you should not return to work or school.  You should rest at home and let your body heal.  ? ?You may shower two days after your surgery.  After showering, lightly dab your incision dry. Do not take a tub bath or go swimming for 3 weeks, or until approved by your doctor at your follow-up appointment. ? ?If you smoke, we strongly recommend that you quit.  Smoking has been proven to interfere with normal healing in your back and will dramatically reduce the success rate of your surgery. Please contact [facility] (800-QUIT-NOW) and use the resources at www.[facility].com for assistance in stopping smoking. ? ?Surgical  Incision ?  ?If you have a dressing on your incision, you may remove it two days after your surgery. Keep your incision area clean and dry. ? ?Your incision was closed with Dermabond glue. The glue should begin to peel away within about a week. ?Diet          ? ? You may return to your usual diet. Be sure to stay hydrated. ? ?When to Contact us ? ?Although your surgery and recovery will likely be uneventful, you may have some residual numbness, aches, and pains in your back and/or legs. This is normal and should improve in the next few weeks. ? ?However, should you experience any of the following, contact us immediately: ?New numbness or weakness ?Pain that is progressively getting worse, and is not relieved by your pain medications or rest ?Bleeding, redness, swelling, pain, or drainage from surgical incision ?Chills or flu-like symptoms ?Fever greater than 101.0 F (38.3 C) ?Problems with bowel or bladder functions ?Difficulty breathing or shortness of breath ?Warmth, tenderness, or swelling in your calf ? ?Contact Information ?During office hours (Monday-Friday 9 am to 5 pm), please call your physician at [phone] ?After hours and weekends, please call [phone] and speak with the answering service, who will contact the doctor on call.  If that fails, call the [facility] Operator at [phone] and ask for the Neurosurgery Resident On Call  ?For a life-threatening emergency, call 911  ? ?        AMBULATORY SURGERY  ?       DISCHARGE INSTRUCTIONS ? ? ?The drugs that you were given will stay  in your system until tomorrow so for the next 24 hours you should not: ? ?Drive an automobile ?Make any legal decisions ?Drink any alcoholic beverage ? ? ?You may resume regular meals tomorrow.  Today it is better to start with liquids and gradually work up to solid foods. ? ?You may eat anything you prefer, but it is better to start with liquids, then soup and crackers, and gradually work up to solid foods. ? ? ?Please notify  your doctor immediately if you have any unusual bleeding, trouble breathing, redness and pain at the surgery site, drainage, fever, or pain not relieved by medication. ? ? ? ?Additional Instructions: ? ?Please contact your physician with any problems or Same Day Surgery at [phone], Monday through Friday 6 am to 4 pm, or [facility] number at [phone].  ?

## 2021-07-18 NOTE — Progress Notes (Signed)
Patient arrived to room 239 in NAD, VS stable and patient free from pain. Patient oriented to room and call bell in reach. Wife at bedside.  ?

## 2021-07-18 NOTE — Progress Notes (Signed)
Dr. Pernell Dupre at bedside, EKG reviewed. Hospitalist consulted.   ?

## 2021-07-18 NOTE — Transfer of Care (Signed)
Immediate Anesthesia Transfer of Care Note ? ?Patient: Erik Wallace ? ?Procedure(s) Performed: L4-5 POSTERIOR SPINAL DECOMPRESSION, LEFT L5-S1  MICRODISCECTOMY (Back) ? ?Patient Location: PACU ? ?Anesthesia Type:General ? ?Level of Consciousness: awake ? ?Airway & Oxygen Therapy: Patient Spontanous Breathing and Patient connected to face mask oxygen ? ?Post-op Assessment: Report given to RN and Post -op Vital signs reviewed and stable ? ?Post vital signs: Reviewed ? ?Last Vitals:  ?Vitals Value Taken Time  ?BP    ?Temp    ?Pulse    ?Resp    ?SpO2    ? ? ?Last Pain:  ?   ? ?  ? ?Complications: No notable events documented. ?

## 2021-07-18 NOTE — H&P (Signed)
I have reviewed and confirmed my history and physical from 07/05/2021 with no additions or changes. Plan for L4-5 decompression and L L5-S1 microdiscectomy.  Risks and benefits reviewed. ? ?Heart sounds normal no MRG. Chest Clear to Auscultation Bilaterally. ? ? ?  ? ?

## 2021-07-18 NOTE — Anesthesia Preprocedure Evaluation (Signed)
Anesthesia Evaluation  ?Patient identified by MRN, date of birth, ID band ?Patient awake ? ? ? ?Reviewed: ?Allergy & Precautions, H&P , NPO status , Patient's Chart, lab work & pertinent test results, reviewed documented beta blocker date and time  ? ?Airway ?Mallampati: II ? ?TM Distance: >3 FB ?Neck ROM: full ? ? ? Dental ? ?(+) Teeth Intact ?  ?Pulmonary ?neg pulmonary ROS,  ?  ?Pulmonary exam normal ? ? ? ? ? ? ? Cardiovascular ?Exercise Tolerance: Good ?negative cardio ROS ?Normal cardiovascular exam ?Rhythm:regular Rate:Normal ? ? ?  ?Neuro/Psych ?negative neurological ROS ? negative psych ROS  ? GI/Hepatic ?negative GI ROS, Neg liver ROS,   ?Endo/Other  ?negative endocrine ROS ? Renal/GU ?negative Renal ROS  ?negative genitourinary ?  ?Musculoskeletal ? ? Abdominal ?  ?Peds ? Hematology ?negative hematology ROS ?(+)   ?Anesthesia Other Findings ?History reviewed. No pertinent past medical history. ?Past Surgical History: ?No date: HERNIA REPAIR; Left ?    Comment:  inguinal hernia ?BMI   ? Body Mass Index: 29.75 kg/m?  ?  ? Reproductive/Obstetrics ?negative OB ROS ? ?  ? ? ? ? ? ? ? ? ? ? ? ? ? ?  ?  ? ? ? ? ? ? ? ? ?Anesthesia Physical ?Anesthesia Plan ? ?ASA: 1 ? ?Anesthesia Plan: General ETT  ? ?Post-op Pain Management:   ? ?Induction:  ? ?PONV Risk Score and Plan: 3 ? ?Airway Management Planned:  ? ?Additional Equipment:  ? ?Intra-op Plan:  ? ?Post-operative Plan:  ? ?Informed Consent: I have reviewed the patients History and Physical, chart, labs and discussed the procedure including the risks, benefits and alternatives for the proposed anesthesia with the patient or authorized representative who has indicated his/her understanding and acceptance.  ? ? ? ?Dental Advisory Given ? ?Plan Discussed with: CRNA ? ?Anesthesia Plan Comments:   ? ? ? ? ? ? ?Anesthesia Quick Evaluation ? ?

## 2021-07-18 NOTE — Discharge Summary (Incomplete Revision)
Physician Discharge Summary  ?Patient ID: ?Erik Wallace ?MRN: [id number] ?DOB/AGE: [birth year] 56 y.o. ? ?Admit date: 07/18/2021 ?Discharge date: 07/18/2021 ? ?Admission Diagnoses: lumbar radiculopathy ? ?Discharge Diagnoses:  ?Active Problems: ?  * No active hospital problems. * ? ? ?Discharged Condition: good ? ?Hospital Course:  ? ?Erik Wallace is a 56 y.o s/p left L5-S1 discectomy, L4-5 decompression on 07/18/21. His interoperative course was uncomplicated. He was monitored in PACU post-op and discharged home after ambulating, urinating, and tolerating PO intake. He was given medications for pain and muscle relaxation.  ? ?Consults: None ? ?Significant Diagnostic Studies: none ? ?Treatments: surgery: as above. Please see separately dictated operative report for further details ? ?Discharge Exam: ?Blood pressure (!) 194/107, pulse 91, temperature 97.8 ?F (36.6 ?C), temperature source Temporal, resp. rate 18, height 5' 10.5" (1.791 m), weight 95.4 kg, SpO2 97 %. ?CN II-XII grossly intact ?5/5 strength throughout except 4- in left PF and EHL ?Incision covered with clean post-op bandage. ? ?Disposition: Discharge disposition: 01-Home or Self Care ? ? ? ? ? ? ?Discharge Instructions   ? ? Remove dressing in 48 hours   Complete by: As directed ?  ? ?  ? ?Allergies as of 07/18/2021   ?No Known Allergies ?  ? ?  ?Medication List  ?  ? ?TAKE these medications   ? ?acetaminophen 500 MG tablet ?Commonly known as: TYLENOL ?Take 500 mg by mouth every 6 (six) hours as needed. ?  ?gabapentin 300 MG capsule ?Commonly known as: NEURONTIN ?Take 300 mg by mouth at bedtime. ?  ?ibuprofen 200 MG tablet ?Commonly known as: ADVIL ?Take 800 mg by mouth 2 (two) times daily as needed for moderate pain. ?  ?methocarbamol 500 MG tablet ?Commonly known as: Robaxin ?Take 1 tablet (500 mg total) by mouth 4 (four) times daily. ?  ?oxyCODONE 5 MG immediate release tablet ?Commonly known as: Roxicodone ?Take 1 tablet (5 mg total) by mouth every 4  (four) hours as needed for up to 5 days for severe pain. ?  ?senna 8.6 MG Tabs tablet ?Commonly known as: SENOKOT ?Take 1 tablet (8.6 mg total) by mouth daily as needed for mild constipation. ?  ? ?  ? ? Follow-up Information   ? ? Susanne Borders, PA Follow up in 2 week(s).   ?Why: for incision check. This appointment date and time should be on your pre-op paperwork ?Contact information: ?[address] ?[place] [address] ?[phone] ? ? ?  ?  ? ?  ?  ? ?  ? ? ?Signed: ?Susanne Borders ?07/18/2021, 9:18 AM ? ? ?

## 2021-07-18 NOTE — Op Note (Signed)
Indications: Mr. Bekker is a 56 yo male who presented with: ?Lumbar radiculopathy M54.16, Neurogenic claudication due to lumbar spinal stenosis M48.062, Left leg weakness R29.898 ? ?Due to weakness, surgery was recommended ? ?Findings: severe stenosis L4-5, disc herniation L5-S1 ? ?Preoperative Diagnosis: Lumbar radiculopathy ?Postoperative Diagnosis: same ? ? ?EBL: 10 ml ?IVF: see AR ml ?Drains: none ?Disposition: Extubated and Stable to PACU ?Complications: none ? ?No foley catheter was placed. ? ? ?Preoperative Note:  ? ?Risks of surgery discussed include: infection, bleeding, stroke, coma, death, paralysis, CSF leak, nerve/spinal cord injury, numbness, tingling, weakness, complex regional pain syndrome, recurrent stenosis and/or disc herniation, vascular injury, development of instability, neck/back pain, need for further surgery, persistent symptoms, development of deformity, and the risks of anesthesia. The patient understood these risks and agreed to proceed. ? ?Operative Note:  ? ?1) Left L5/S1 microdiscectomy ?2) L4-5 decompression ? ?The patient was then brought from the preoperative center with intravenous access established.  The patient underwent general anesthesia and endotracheal tube intubation, and was then rotated on the Townsend rail top where all pressure points were appropriately padded.  The skin was then thoroughly cleansed.  Perioperative antibiotic prophylaxis was administered.  Sterile prep and drapes were then applied and a timeout was then observed.  C-arm was brought into the field under sterile conditions, and the L5-S1 disc space identified and marked with an incision on the left 1cm lateral to midline. ? ?Once this was complete a 4 cm incision was opened with the use of a #10 blade knife.  The Metrx tubes were sequentially advanced under lateral fluoroscopy until a 18 x 50 mm Metrx tube was placed over the facet and lamina and secured to the bed.   ? ?The microscope was then sterilely  brought into the field and muscle creep was hemostased with a bipolar and resected with a pituitary rongeur.  A Bovie extender was then used to expose the spinous process and lamina.  Careful attention was placed to not violate the facet capsule. A 3 mm matchstick drill bit was then used to make a hemi-laminotomy trough until the ligamentum flavum was exposed.  This was extended to the base of the spinous process.  Once this was complete and the underlying ligamentum flavum was visualized this was dissected with an up angle curette and resected with a #2 and #3 mm biting Kerrison.  The laminotomy opening was also expanded in similar fashion and hemostasis was obtained with Surgifoam and a patty as well as bone wax.  The rostral aspect of the caudal level of the lamina was also resected with a #2 biting Kerrison effort to further enhance exposure.  Once the underlying dura was visualized a Penfield 4 was then used to dissect and expose the traversing nerve root.  Once this was identified a nerve root retractor suction was used to mobilize this medially.  The venous plexus was hemostased with Surgifoam and light bipolar use.  A small penfied was then used to make a small annulotomy within the disc space and disc space contents were noted to come through the annulus.   ? ?The disc herniation was identified and dissected free using a balltip probe. The pituitary rongeur was used to remove the extruded disc fragments. Once the thecal sac and nerve root were noted to be relaxed and under less tension the ball-tipped feeler was passed along the foramen distally to to ensure no residual compression was noted.   ? ?Depo-Medrol was placed along the nerve root.  The area was irrigated. The tube system was then removed under microscopic visualization and hemostasis was obtained with a bipolar.   ? ?After performing the decompression at L5-S1, the metrx tubes were sequentially advanced and confirmed in position at L4-5. An 39mm  by 68mm tube was locked in place to the bed side attachment.  Fluoroscopy was then removed from the field.  The microscope was then sterilely brought into the field and muscle creep was hemostased with a bipolar and resected with a pituitary rongeur.  A Bovie extender was then used to expose the spinous process and lamina.  Careful attention was placed to not violate the facet capsule. A 3 mm matchstick drill bit was then used to make a hemi-laminotomy trough until the ligamentum flavum was exposed.  This was extended to the base of the spinous process and to the contralateral side to remove all the central bone from each side.  Once this was complete and the underlying ligamentum flavum was visualized, it was dissected with a curette and resected with Kerrison rongeurs.  Extensive ligamentum hypertrophy was noted, requiring a substantial amount of time and care for removal.  The dura was identified and palpated. The kerrison rongeur was then used to remove the medial facet bilaterally until no compression was noted.  A balltip probe was used to confirm decompression of the ipsilateral L5 nerve root. ? ?Additional attention was paid to completion of the contralateral foraminotomy until the contralateral L5 nerve root was completely free.  Once this was complete, L4-5 central decompression including medial facetectomy and foraminotomy was confirmed and decompression on both sides was confirmed. No CSF leak was noted. ? ?A Depo-Medrol soaked Gelfoam pledget was placed in the defect.  The wound was copiously irrigated. The tube system was then removed under microscopic visualization and hemostasis was obtained with a bipolar.   ? ? ?The fascial layer was reapproximated with the use of a 0- Vicryl suture.  Subcutaneous tissue layer was reapproximated using 2-0 Vicryl suture.  3-0 monocryl was used on the skin. The skin was then cleansed and Dermabond was used to close the skin opening.  Patient was then rotated back to  the preoperative bed awakened from anesthesia and taken to recovery all counts are correct in this case. ? ? ?I performed the entire procedure with the assistance of Manning Charity PA as an Designer, television/film set. ? ?Venetia Night MD ? ?

## 2021-07-18 NOTE — Discharge Summary (Addendum)
? ?   Attending Progress Note ? ?History: Erik Wallace is s/p L4-5 decompression left L5-S1 discectomy.  ? ?POD1: Admitted overnight for cardiac evaluation. Reports good pain control this morning ? ?Physical Exam: ?Vitals:  ? 07/19/21 0457 07/19/21 0718  ?BP: 133/78 (!) 156/98  ?Pulse: 75 79  ?Resp: 18 19  ?Temp: (!) 97.5 ?F (36.4 ?C) 98.4 ?F (36.9 ?C)  ?SpO2: 95% 98%  ? ? ?AA Ox3 ?CNI ? ?Strength:5/5 throughout BLE except 4- in left EHL  ?Incision c/d/i ? ?Data: ? ?Recent Labs  ?Lab 07/18/21 ?1355  ?NA 135  ?K 3.6  ?CL 99  ?CO2 25  ?BUN 19  ?CREATININE 1.12  ?GLUCOSE 232*  ?CALCIUM 8.4*  ? ?No results for input(s): AST, ALT, ALKPHOS in the last 168 hours. ? ?Invalid input(s): TBILI  ? Recent Labs  ?Lab 07/18/21 ?1109  ?WBC 9.4  ?HGB 15.8  ?HCT 46.1  ?PLT 251  ? ?No results for input(s): APTT, INR in the last 168 hours.  ?   ? ? ?Other tests/results: per medicine  ? ?Assessment/Plan: ? ?Erik Wallace is a 56 y.o s/p L4-5 decompression, left L5-S1 discectomy admitted for cardiac workup ? ?- mobilize ?- pain control ?- ok for DVT prophylaxis from neurosurgical standpoint ?- stable for discharge from neurosugical standpoint ?- remainder of care per medicine.  ?- Please call with any questions or concerns.  ? ?Manning Charity PA-C ?Department of Neurosurgery ? ?  ? ?

## 2021-07-18 NOTE — Progress Notes (Signed)
?   07/18/21 0700  ?Clinical Encounter Type  ?Visited With Patient and family together  ?Visit Type Initial;Pre-op  ? ?Chaplain provided pre-op support. ?

## 2021-07-19 DIAGNOSIS — M48062 Spinal stenosis, lumbar region with neurogenic claudication: Secondary | ICD-10-CM | POA: Diagnosis not present

## 2021-07-19 DIAGNOSIS — I160 Hypertensive urgency: Secondary | ICD-10-CM | POA: Diagnosis not present

## 2021-07-19 LAB — LIPID PANEL
Cholesterol: 228 mg/dL — ABNORMAL HIGH (ref 0–200)
HDL: 39 mg/dL — ABNORMAL LOW (ref >40)
LDL Cholesterol: 153 mg/dL — ABNORMAL HIGH (ref 0–99)
Total CHOL/HDL Ratio: 5.8 RATIO
Triglycerides: 181 mg/dL — ABNORMAL HIGH (ref <150)
VLDL: 36 mg/dL (ref 0–40)

## 2021-07-19 LAB — HEMOGLOBIN A1C
Hgb A1c MFr Bld: 5.9 % — ABNORMAL HIGH (ref 4.8–5.6)
Mean Plasma Glucose: 122.63 mg/dL

## 2021-07-19 MED ORDER — LOSARTAN POTASSIUM 25 MG PO TABS: 25.0000 mg | ORAL_TABLET | Freq: Every day | ORAL | 1 refills | Status: DC

## 2021-07-19 MED ORDER — ASPIRIN 81 MG PO TBEC: 81.0000 mg | DELAYED_RELEASE_TABLET | Freq: Every day | ORAL | 11 refills | Status: DC

## 2021-07-19 MED ORDER — ROSUVASTATIN CALCIUM 20 MG PO TABS
20.0000 mg | ORAL_TABLET | Freq: Every day | ORAL | 1 refills | Status: DC
Start: 2021-07-19 — End: 2021-10-30

## 2021-07-19 MED ORDER — ROSUVASTATIN CALCIUM 10 MG PO TABS
20.0000 mg | ORAL_TABLET | Freq: Every day | ORAL | Status: DC
  Filled 2021-07-19: qty 2

## 2021-07-19 MED ORDER — LOSARTAN POTASSIUM 25 MG PO TABS
25.0000 mg | ORAL_TABLET | Freq: Every day | ORAL | Status: DC
  Filled 2021-07-19: qty 1

## 2021-07-19 NOTE — Discharge Summary (Signed)
?Physician Discharge Summary ?  ?Patient: Erik Wallace MRN: [id number] DOB: 07/19/1965  ?Admit date:     07/18/2021  ?Discharge date: 07/19/21  ?Discharge Physician: Arnetha CourserSumayya Monque Haggar  ? ?PCP: Pcp, No  ? ?Recommendations at discharge:  ?1.  Follow-up with primary care doctor within a week ?Follow-up with your neurosurgeon according to your appointment ? ?Discharge Diagnoses: ?Principal Problem: ?  Hypertensive urgency ?Active Problems: ?  Lumbar radiculopathy ?  Spinal stenosis at L4-L5 level ? ?Resolved Problems: ?  * No resolved hospital problems. * ? ?Hospital Course: ?Erik Wallace is a 56 y.o. male with medical history significant of chronic back pain due to lumbar radiculopathy and spinal stenosis at L4-L5 level, who presents with back pain and elevated blood pressure. ?  ?Patient had underwent scheduled procedure for his back pain by Dr. Marcell BarlowYarborough of neurosurgery. Pt is s/p of left L5-S1 discectomy and 52 n on today 07/18/21. After the surgery, pt was found to have significantly elevated blood pressure at 194/107, which improved to SBP 140-160s after giving 2 doses of labetalol 5 mg in PACU.  Patient is asymptomatic.  He does not have chest pain, shortness breath.  No fever or chills.  No leg edema.  Patient stated he had some diarrhea recently which he attributed to ibuprofen use, but states that his diarrhea has resolved.  Currently no nausea, vomiting, abdominal pain or diarrhea.  No symptoms of UTI.  Patient denies history of hypertension.  ? ?Admitted for hypertensive urgency.  Received multiple doses of labetalol and was started on losartan.  Blood pressure improving. ?Lipid profile with ASCVD of 23.5% requiring high intensity statin, risk can be decreased to 3.6% with risk modifications.  Have an extensive discussion with patient as he was very reluctant to start statin.  Patient was discharged on Crestor, aspirin and losartan.  A1c was also 5.9 with mild hyperglycemia.  That makes him prediabetic.  He  was advised to make lifestyle modification and follow-up very closely with primary care provider for further recommendations. ? ?He will follow-up with neurosurgery according to his appointment for his recent spinal surgery ? ?Pain control - [employer] [place] Controlled Substance Reporting System database was reviewed. and patient was instructed, not to drive, operate heavy machinery, perform activities at heights, swimming or participation in water activities or provide baby-sitting services while on Pain, Sleep and Anxiety Medications; until their outpatient Physician has advised to do so again. Also recommended to not to take more than prescribed Pain, Sleep and Anxiety Medications.  ?Consultants: Neurosurgery ?Procedures performed:   L4-5 decompression left L5-S1 discectomy ?Disposition: Home ?Diet recommendation:  ?Discharge Diet Orders (From admission, onward)  ? ?  Start     Ordered  ? 07/19/21 0000  Diet - low sodium heart healthy       ? 07/19/21 1018  ? ?  ?  ? ?  ? ?Cardiac and Carb modified diet ?DISCHARGE MEDICATION: ?Allergies as of 07/19/2021   ?No Known Allergies ?  ? ?  ?Medication List  ?  ? ?TAKE these medications   ? ?acetaminophen 500 MG tablet ?Commonly known as: TYLENOL ?Take 500 mg by mouth every 6 (six) hours as needed. ?  ?aspirin 81 MG EC tablet ?Take 1 tablet (81 mg total) by mouth daily. Swallow whole. ?  ?gabapentin 300 MG capsule ?Commonly known as: NEURONTIN ?Take 300 mg by mouth at bedtime. ?  ?ibuprofen 200 MG tablet ?Commonly known as: ADVIL ?Take 800 mg by mouth 2 (two) times daily  as needed for moderate pain. ?  ?losartan 25 MG tablet ?Commonly known as: COZAAR ?Take 1 tablet (25 mg total) by mouth daily. ?  ?methocarbamol 500 MG tablet ?Commonly known as: Robaxin ?Take 1 tablet (500 mg total) by mouth 4 (four) times daily. ?  ?oxyCODONE 5 MG immediate release tablet ?Commonly known as: Roxicodone ?Take 1 tablet (5 mg total) by mouth every 4 (four) hours as needed for up to 5 days  for severe pain. ?  ?rosuvastatin 20 MG tablet ?Commonly known as: CRESTOR ?Take 1 tablet (20 mg total) by mouth daily. ?  ?senna 8.6 MG Tabs tablet ?Commonly known as: SENOKOT ?Take 1 tablet (8.6 mg total) by mouth daily as needed for mild constipation. ?  ? ?  ? ?  ?  ? ? ?  ?Discharge Care Instructions  ?(From admission, onward)  ?  ? ? ?  ? ?  Start     Ordered  ? 07/19/21 0000  Leave dressing on - Keep it clean, dry, and intact until clinic visit       ? 07/19/21 1018  ? ?  ?  ? ?  ? ? Follow-up Information   ? ? Susanne Borders, PA Follow up in 2 week(s).   ?Why: for incision check. This appointment date and time should be on your pre-op paperwork ?Already scheduled for Friday April 14th, 2023 ?Contact information: ?[address] ?[place] [address] ?[phone] ? ? ?  ?  ? ?  ?  ? ?  ? ?Discharge Exam: ?Erik Wallace  ? 07/18/21 0622  ?Weight: 95.4 kg  ? ?General.     In no acute distress. ?Pulmonary.  Lungs clear bilaterally, normal respiratory effort. ?CV.  Regular rate and rhythm, no JVD, rub or murmur. ?Abdomen.  Soft, nontender, nondistended, BS positive. ?CNS.  Alert and oriented x3.  No focal neurologic deficit. ?Extremities.  No edema, no cyanosis, pulses intact and symmetrical. ?Psychiatry.  Judgment and insight appears normal.  ? ?Condition at discharge: stable ? ?The results of significant diagnostics from this hospitalization (including imaging, microbiology, ancillary and laboratory) are listed below for reference.  ? ?Imaging Studies: ?DG Lumbar Spine 2-3 Views ? ?Result Date: 07/18/2021 ?CLINICAL DATA:  L4-L5 and L5-S1 microdiscectomy EXAM: LUMBAR SPINE - 2-3 VIEW COMPARISON:  Lumbar spine MRI 06/14/2021 FINDINGS: 4 lateral C-arm fluoroscopic images were obtained intraoperatively and submitted for post operative interpretation. On the initial images, surgical hardware is noted posterior to the L5-S1 disc space. The next 2 images demonstrate hardware posterior to the L4-L5 disc space.  Fluoro time 5 seconds. Please see the performing provider's procedural report for further detail. IMPRESSION: Intraoperative localization images for L4-L5 and L5-S1 microdiscectomy. Electronically Signed   By: Lesia Hausen M.D.   On: 07/18/2021 08:26  ? ?MR CERVICAL SPINE WO CONTRAST ? ?Result Date: 07/14/2021 ?CLINICAL DATA:  Abnormal reflexes. EXAM: MRI CERVICAL SPINE WITHOUT CONTRAST TECHNIQUE: Multiplanar, multisequence MR imaging of the cervical spine was performed. No intravenous contrast was administered. COMPARISON:  None. FINDINGS: Alignment: Physiologic. Vertebrae: No acute fracture, evidence of discitis, or aggressive bone lesion. Cord: Normal signal and morphology. Posterior Fossa, vertebral arteries, paraspinal tissues: Posterior fossa demonstrates no focal abnormality. Vertebral artery flow voids are maintained. Paraspinal soft tissues are unremarkable. Disc levels: Discs: Disc spaces are relatively well maintained. C2-3: No significant disc bulge. No neural foraminal stenosis. No central canal stenosis. C3-4: No significant disc bulge. No right foraminal stenosis. Minimal left foraminal stenosis. No spinal stenosis. C4-5:  No significant disc bulge. No right foraminal stenosis. Mild left foraminal stenosis. No spinal stenosis. C5-6: Minimal broad-based disc bulge. Mild right and moderate left foraminal stenosis. No spinal stenosis. C6-7: No significant disc bulge. No neural foraminal stenosis. No central canal stenosis. C7-T1: No significant disc bulge. No neural foraminal stenosis. No central canal stenosis. IMPRESSION: 1. Mild cervical spine spondylosis as described above. 2. No focal abnormality of the cervical spinal cord. 3.  No acute osseous injury of the cervical spine. Electronically Signed   By: Elige Ko M.D.   On: 07/14/2021 07:06  ? ?DG C-Arm 1-60 Min-No Report ? ?Result Date: 07/18/2021 ?Fluoroscopy was utilized by the requesting physician.  No radiographic interpretation.    ? ?Microbiology: ?Results for orders placed or performed during the hospital encounter of 07/11/21  ?Surgical pcr screen     Status: None  ? Collection Time: 07/11/21  9:10 AM  ? Specimen: Nasal Mucosa; Nasal Swab

## 2021-07-19 NOTE — TOC CM/SW Note (Signed)
Patient has orders to discharge home today. Chart reviewed. On room air. Has incision on back (honeycomb, ABD). No TOC needs identified. CSW signing off. ? ?Dayton Scrape, [place] ?[phone] ? ?

## 2021-07-24 NOTE — Anesthesia Postprocedure Evaluation (Signed)
Anesthesia Post Note ? ?Patient: LEMARIO GIARRAPUTO ? ?Procedure(s) Performed: L4-5 POSTERIOR SPINAL DECOMPRESSION, LEFT L5-S1  MICRODISCECTOMY (Back) ? ?Patient location during evaluation: PACU ?Anesthesia Type: General ?Level of consciousness: awake and alert ?Pain management: pain level controlled ?Vital Signs Assessment: post-procedure vital signs reviewed and stable ?Respiratory status: spontaneous breathing, nonlabored ventilation, respiratory function stable and patient connected to nasal cannula oxygen ?Cardiovascular status: blood pressure returned to baseline and stable ?Postop Assessment: no apparent nausea or vomiting ?Anesthetic complications: no ? ? ?No notable events documented. ? ? ?Last Vitals:  ?Vitals:  ? 07/19/21 0457 07/19/21 0718  ?BP: 133/78 (!) 156/98  ?Pulse: 75 79  ?Resp: 18 19  ?Temp: (!) 36.4 ?C 36.9 ?C  ?SpO2: 95% 98%  ?  ?Last Pain:  ?Vitals:  ? 07/19/21 0957  ?TempSrc:   ?PainSc: 4   ? ? ?  ?  ?  ?  ?  ?  ? ?Molli Barrows ? ? ? ? ?

## 2021-10-25 ENCOUNTER — Emergency Department: Payer: Worker's Compensation

## 2021-10-25 ENCOUNTER — Emergency Department
Admission: EM | Admit: 2021-10-25 | Discharge: 2021-10-25 | Disposition: A | Discharge status: Home / Self Care | Payer: Worker's Compensation | Attending: Emergency Medicine | Admitting: Emergency Medicine

## 2021-10-25 ENCOUNTER — Other Ambulatory Visit: Payer: Self-pay

## 2021-10-25 DIAGNOSIS — I10 Essential (primary) hypertension: Secondary | ICD-10-CM | POA: Diagnosis not present

## 2021-10-25 DIAGNOSIS — M79605 Pain in left leg: Secondary | ICD-10-CM | POA: Diagnosis present

## 2021-10-25 MED ORDER — HYDROMORPHONE HCL 1 MG/ML IJ SOLN
1.0000 mg | Freq: Once | INTRAMUSCULAR | Status: AC
  Administered 2021-10-25: 1 mg via INTRAMUSCULAR
  Filled 2021-10-25: qty 1

## 2021-10-25 MED ORDER — OXYCODONE-ACETAMINOPHEN 5-325 MG PO TABS
1.0000 | ORAL_TABLET | Freq: Once | ORAL | Status: AC
  Administered 2021-10-25: 1 via ORAL
  Filled 2021-10-25: qty 1

## 2021-10-25 MED ORDER — IBUPROFEN 200 MG PO TABS: 400.0000 mg | ORAL_TABLET | Freq: Three times a day (TID) | ORAL | 0 refills | Status: DC | PRN

## 2021-10-25 MED ORDER — MORPHINE SULFATE (PF) 4 MG/ML IV SOLN
6.0000 mg | Freq: Once | INTRAVENOUS | Status: DC
Start: 2021-10-25 — End: 2021-10-25
  Filled 2021-10-25: qty 2

## 2021-10-25 MED ORDER — OXYCODONE-ACETAMINOPHEN 5-325 MG PO TABS: 1.0000 | ORAL_TABLET | Freq: Three times a day (TID) | ORAL | 0 refills | Status: DC | PRN

## 2021-10-25 NOTE — ED Provider Triage Note (Signed)
Emergency Medicine Provider Triage Evaluation Note  Erik Wallace , a 56 y.o. male  was evaluated in triage.  Pt complains of leg pain, felt a pop posteriorly, cannot use leg.  Review of Systems  Positive: See above Negative: Clemastine  Physical Exam  BP (!) 190/124 Comment: twice checked, pt in pain  Pulse 100   Temp 98 F (36.7 C)   Resp 18   SpO2 100%  Gen:   Awake, no distress   Resp:  Normal effort  MSK:   Moves extremities without difficulty  Other:    Medical Decision Making  Medically screening exam initiated at 5:02 PM.  Appropriate orders placed.  AREL TIPPEN was informed that the remainder of the evaluation will be completed by another provider, this initial triage assessment does not replace that evaluation, and the importance of remaining in the ED until their evaluation is complete.     Faythe Ghee, PA-C 10/25/21 1706

## 2021-10-25 NOTE — Discharge Instructions (Signed)
Myself and radiologist did not see a specific tear on her MRI that showed a partially of a small tear and some spasming.  I recommend 400 mg a Profen every 6 hours and using only Percocet as needed for severe breakthrough pain.  Please return for any new or worsening of symptoms.

## 2021-10-25 NOTE — ED Triage Notes (Addendum)
Pt comes with c/o left leg pain after lifting a fan. Pt states he went to [facility] in [place] and was given pain med shot but no relief. Pt states they think he might have tear of something.   Pt did this at work and will file for workers comp.

## 2021-10-25 NOTE — ED Provider Notes (Signed)
[facility] Provider Note    Event Date/Time   First MD Initiated Contact with Patient 10/25/21 1752     (approximate)   History   Leg Pain   HPI  Erik Wallace is a 56 y.o. male  medical history significant of chronic back pain due to lumbar radiculopathy and spinal stenosis at L4-L5 level s/p lumbar decompression L5-S1 3/29 who presents after initially being seen in urgent care for assessment of some acute left posterior calf pain.  Patient states he was at work when he lunged quickly to stabilize a large fan unit and become unstable and felt acute pain in his left posterior calf.  He states he has had some tightness last week after moving a cooler and has had some mild radiculopathy since surgery but otherwise he feels the pain has been improved since the surgery until he got acute pain in his left leg today.  He does not have any new lumbar pain, abdominal pain, urinary symptoms, hip pain, knee pain or ankle pain on either lower extremity.  No numbness.  He received some Toradol urgent care cannot emergency room because he felt his pain was uncontrolled.  States his blood pressure is always high when he is in severe pain but is normal and his neurosurgery outpatient visits.  No other acute concerns at this time.    Past Medical History:  Diagnosis Date   Back pain    Lumbar radiculopathy    Spinal stenosis at L4-L5 level      Physical Exam  Triage Vital Signs: ED Triage Vitals  Enc Vitals Group     BP 10/25/21 1651 (!) 190/124     Pulse Rate 10/25/21 1651 100     Resp 10/25/21 1651 18     Temp 10/25/21 1651 98 F (36.7 C)     Temp src --      SpO2 10/25/21 1651 100 %     Weight --      Height --      Head Circumference --      Peak Flow --      Pain Score 10/25/21 1650 10     Pain Loc --      Pain Edu? --      Excl. in GC? --     Most recent vital signs: Vitals:   10/25/21 1651 10/25/21 1914  BP: (!) 190/124 (!) 175/118  Pulse: 100  99  Resp: 18 18  Temp: 98 F (36.7 C)   SpO2: 100% 96%    General: Awake, peers very uncomfortable. CV:  Good peripheral perfusion.  2+ radial pulses and PT pulses. Resp:  Normal effort.  Abd:  No distention.  Soft. Other:  No midline T or L-spine pain.  Patient has symmetric strength in his bilateral lower extremities.  Sensation is intact to light touch throughout.  2+ bilateral patellar reflexes.  Patient does have an area of very tight muscles and tenderness on the posterior medial aspect of the left mid calf.  No other overlying skin changes.   ED Results / Procedures / Treatments  Labs (all labs ordered are listed, but only abnormal results are displayed) Labs Reviewed - No data to display   EKG    RADIOLOGY  MRI of the left femur on my interpretation without clear large muscle tear or tendon injury.  I do not see any evidence of a large hematoma or edema.  I reviewed radiology interpretation and agree to findings  of same in addition to some osteoarthritis.   PROCEDURES:  Critical Care performed: No  Procedures    MEDICATIONS ORDERED IN ED: Medications  HYDROmorphone (DILAUDID) injection 1 mg (has no administration in time range)  oxyCODONE-acetaminophen (PERCOCET/ROXICET) 5-325 MG per tablet 1 tablet (1 tablet Oral Given 10/25/21 1702)     IMPRESSION / MDM / ASSESSMENT AND PLAN / ED COURSE  I reviewed the triage vital signs and the nursing notes. Patient's presentation is most consistent with acute, uncomplicated illness.                               Differential diagnosis includes, but is not limited to muscle tear, possible small hematoma with lower suspicion for acute orthopedic injury or infectious process given his history and exam.  MRI as Was ordered in triage.  MRI of the left femur on my interpretation without clear large muscle tear or tendon injury.  I do not see any evidence of a large hematoma or edema.  I reviewed radiology interpretation and  agree to findings of same in addition to some osteoarthritis.  History exam and MRIs not suggestive of acute fracture, DVT, infectious process I suspect partial tear in the muscle spasm.  He is still in severe pain unable to ambulate after receiving Toradol so we will give a one-time IM dose of Dilaudid and a very short course of Percocet.  Discussed using this only as needed for severe breakthrough pain using 400 mg ibuprofen and following up with his PCP.  Also advised to have blood pressure rechecked.  Discharged in stable condition.  Strict return precautions advised and discussed.        FINAL CLINICAL IMPRESSION(S) / ED DIAGNOSES   Final diagnoses:  Acute leg pain, left  Hypertension, unspecified type     Rx / DC Orders   ED Discharge Orders          Ordered    oxyCODONE-acetaminophen (PERCOCET) 5-325 MG tablet  Every 8 hours PRN        10/25/21 1917    ibuprofen (ADVIL) 200 MG tablet  Every 8 hours PRN        10/25/21 1917             Note:  This document was prepared using Dragon voice recognition software and may include unintentional dictation errors.   Gilles Chiquito, MD 10/25/21 Jerene Bears

## 2021-10-27 ENCOUNTER — Other Ambulatory Visit: Payer: Self-pay

## 2021-10-27 ENCOUNTER — Emergency Department
Admission: EM | Admit: 2021-10-27 | Discharge: 2021-10-27 | Disposition: A | Discharge status: Home / Self Care | Payer: Worker's Compensation | Attending: Emergency Medicine | Admitting: Emergency Medicine

## 2021-10-27 ENCOUNTER — Emergency Department: Payer: Commercial Managed Care - PPO

## 2021-10-27 DIAGNOSIS — M5126 Other intervertebral disc displacement, lumbar region: Secondary | ICD-10-CM | POA: Insufficient documentation

## 2021-10-27 DIAGNOSIS — M79605 Pain in left leg: Secondary | ICD-10-CM | POA: Diagnosis present

## 2021-10-27 LAB — CBC WITH DIFFERENTIAL/PLATELET
Abs Immature Granulocytes: 0.03 10*3/uL (ref 0.00–0.07)
Basophils Absolute: 0 10*3/uL (ref 0.0–0.1)
Basophils Relative: 0 %
Eosinophils Absolute: 0.3 10*3/uL (ref 0.0–0.5)
Eosinophils Relative: 4 %
HCT: 48.5 % (ref 39.0–52.0)
Hemoglobin: 16.8 g/dL (ref 13.0–17.0)
Immature Granulocytes: 0 %
Lymphocytes Relative: 27 %
Lymphs Abs: 2 10*3/uL (ref 0.7–4.0)
MCH: 32.4 pg (ref 26.0–34.0)
MCHC: 34.6 g/dL (ref 30.0–36.0)
MCV: 93.6 fL (ref 80.0–100.0)
Monocytes Absolute: 0.7 10*3/uL (ref 0.1–1.0)
Monocytes Relative: 9 %
Neutro Abs: 4.4 10*3/uL (ref 1.7–7.7)
Neutrophils Relative %: 60 %
Platelets: 250 10*3/uL (ref 150–400)
RBC: 5.18 MIL/uL (ref 4.22–5.81)
RDW: 12.6 % (ref 11.5–15.5)
WBC: 7.4 10*3/uL (ref 4.0–10.5)
nRBC: 0 % (ref 0.0–0.2)

## 2021-10-27 LAB — BASIC METABOLIC PANEL
Anion gap: 10 (ref 5–15)
BUN: 16 mg/dL (ref 6–20)
CO2: 24 mmol/L (ref 22–32)
Calcium: 9.3 mg/dL (ref 8.9–10.3)
Chloride: 103 mmol/L (ref 98–111)
Creatinine, Ser: 1.05 mg/dL (ref 0.61–1.24)
GFR, Estimated: >60 mL/min (ref >60)
Glucose, Bld: 129 mg/dL — ABNORMAL HIGH (ref 70–99)
Potassium: 3.9 mmol/L (ref 3.5–5.1)
Sodium: 137 mmol/L (ref 135–145)

## 2021-10-27 MED ORDER — PREDNISONE 20 MG PO TABS: 60.0000 mg | ORAL_TABLET | Freq: Every day | ORAL | 0 refills | Status: AC

## 2021-10-27 MED ORDER — KETOROLAC TROMETHAMINE 15 MG/ML IJ SOLN
15.0000 mg | Freq: Once | INTRAMUSCULAR | Status: AC
  Administered 2021-10-27: 15 mg via INTRAVENOUS
  Filled 2021-10-27: qty 1

## 2021-10-27 MED ORDER — HYDROMORPHONE HCL 1 MG/ML IJ SOLN
1.0000 mg | Freq: Once | INTRAMUSCULAR | Status: AC
  Administered 2021-10-27: 1 mg via INTRAVENOUS
  Filled 2021-10-27: qty 1

## 2021-10-27 MED ORDER — OXYCODONE-ACETAMINOPHEN 5-325 MG PO TABS: 1.0000 | ORAL_TABLET | ORAL | 0 refills | Status: DC | PRN

## 2021-10-27 MED ORDER — IBUPROFEN 600 MG PO TABS: 600.0000 mg | ORAL_TABLET | Freq: Four times a day (QID) | ORAL | 0 refills | Status: DC | PRN

## 2021-10-27 MED ORDER — DEXAMETHASONE SODIUM PHOSPHATE 10 MG/ML IJ SOLN
10.0000 mg | Freq: Once | INTRAMUSCULAR | Status: AC
Start: 2021-10-27 — End: 2021-10-27
  Administered 2021-10-27: 10 mg via INTRAVENOUS
  Filled 2021-10-27: qty 1

## 2021-10-27 NOTE — Discharge Instructions (Addendum)
Take the prednisone as prescribed starting tomorrow.  Take the ibuprofen 600 mg every 6 hours as the baseline pain medication and the Percocet on top of this if needed for more pain relief.  Make an appointment to follow-up with Dr. Myer Haff soon as possible.  Return to the ER immediately for new, worsening, or persistent severe pain, worsening or spreading numbness, any weakness of the leg, inability to walk, or any other new or worsening symptoms that concern you.

## 2021-10-27 NOTE — ED Provider Notes (Signed)
[facility] Provider Note    Event Date/Time   First MD Initiated Contact with Patient 10/27/21 [phone]     (approximate)   History   Leg Pain   HPI  Erik Wallace is a 56 y.o. male with a history of lumbar radiculopathy and spinal stenosis (status post lumbar decompression on 3/29) who presents with left leg pain and numbness.  The patient states that he initially started having pain 2 days ago when he was at work and lunged to catch a large fan that was about to fall.  He was seen initially in urgent care and then in the ED at that time and had an MRI of his left leg which was normal.  Since leaving the hospital he has had persistent severe pain not relieved by the Percocet he was prescribed, and has also had numbness and tingling going down the back of the leg and into the calf and foot.  This started over the last day.  He states he has been unable to walk due to the pain.  He denies any urinary incontinence.  He has no acute back pain.    Physical Exam   Triage Vital Signs: ED Triage Vitals  Enc Vitals Group     BP 10/27/21 0753 (!) 167/112     Pulse Rate 10/27/21 0753 80     Resp 10/27/21 0753 18     Temp 10/27/21 0753 98.2 F (36.8 C)     Temp Source 10/27/21 0753 Oral     SpO2 10/27/21 0753 96 %     Weight 10/27/21 0756 200 lb (90.7 kg)     Height 10/27/21 0756 5\' 10"  (1.778 m)     Head Circumference --      Peak Flow --      Pain Score 10/27/21 0756 10     Pain Loc --      Pain Edu? --      Excl. in GC? --     Most recent vital signs: Vitals:   10/27/21 1200 10/27/21 1221  BP: (!) 170/106   Pulse: 65 72  Resp: 16 16  Temp:  98 F (36.7 C)  SpO2: 97% 97%     General: Alert and oriented, uncomfortable appearing. CV:  Good peripheral perfusion.  Resp:  Normal effort.  Abd:  No distention.  Other:  Full range of motion to the left hip, knee, and ankle.  Left leg nontender.  2+ DP pulse.  Cap refill less than 2 seconds.  Decreased  sensation to posterior lower leg and foot.  5/5 motor strength distally although patient is limited in ability to lift the left leg due to pain.   ED Results / Procedures / Treatments   Labs (all labs ordered are listed, but only abnormal results are displayed) Labs Reviewed  BASIC METABOLIC PANEL - Abnormal; Notable for the following components:      Result Value   Glucose, Bld 129 (*)    All other components within normal limits  CBC WITH DIFFERENTIAL/PLATELET     EKG     RADIOLOGY  MR lumbar spine:   IMPRESSION: 1. L5-S1 left paracentral herniation impinging on the left S1 nerve root. 2. L4-5 degeneration and anterolisthesis. Moderate spinal stenosis that is improved after decompression.   PROCEDURES:  Critical Care performed: No  Procedures   MEDICATIONS ORDERED IN ED: Medications  HYDROmorphone (DILAUDID) injection 1 mg (1 mg Intravenous Given 10/27/21 0851)  ketorolac (TORADOL) 15 MG/ML  injection 15 mg (15 mg Intravenous Given 10/27/21 0851)  HYDROmorphone (DILAUDID) injection 1 mg (1 mg Intravenous Given 10/27/21 1213)  dexamethasone (DECADRON) injection 10 mg (10 mg Intravenous Given 10/27/21 1214)     IMPRESSION / MDM / ASSESSMENT AND PLAN / ED COURSE  I reviewed the triage vital signs and the nursing notes.  56 year old male with PMH as noted above presents with persistent worsening left leg pain after an injury at work a few days ago.  I reviewed the past medical records.  The patient had a successful and uncomplicated L4-5 spinal decompression and L5-S1 microdiscectomy on 3/29 and was last seen by neurosurgery on 6/22 with good pain relief.  He was seen in the ED on 7/6 for the current pain and MRI of the left femur was negative for acute findings.  Exam reveals decreased sensation to the posterior leg but no motor deficits.  Given the lack of any tenderness or swelling in the leg, and the negative recent MRI, and the new presence of numbness, I am concerned  more for spinal etiology than any specific injury to the leg.  Differential diagnosis includes, but is not limited to, lumbar radiculopathy, sciatica, nerve compression due to disc, less likely compression fracture.  The patient's sensory deficit covers roughly the L5-S2 distribution.  We will obtain basic labs, MRI of the lumbar spine, and give analgesia.  Patient's presentation is most consistent with acute presentation with potential threat to life or bodily function.  ----------------------------------------- 12:38 PM on 10/27/2021 -----------------------------------------  MRI shows a left L5-S1 paracentral herniation consistent with the patient's symptoms.  On reassessment the patient reports that his pain is somewhat improved and he can move his leg relatively well.  On repeat examination there is no weakness.  I consulted Dr. Myer Haff from neurosurgery and discussed he imaging and clinical presentation with him.  He advises that the patient should receive steroids and should follow-up as an outpatient although if his pain is not well enough controlled or he cannot ambulate, he could be admitted if needed.  I discussed this with the patient.  The patient has a strong preference to go home.  He reports relatively good pain relief and would like to be discharged.  He is stable for discharge at this time.  I instructed him to follow-up with Dr. Marcell Barlow.  I gave him thorough return precautions and he expressed understanding.    FINAL CLINICAL IMPRESSION(S) / ED DIAGNOSES   Final diagnoses:  Lumbar herniated disc     Rx / DC Orders   ED Discharge Orders          Ordered    predniSONE (DELTASONE) 20 MG tablet  Daily with breakfast        10/27/21 1234    ibuprofen (ADVIL) 600 MG tablet  Every 6 hours PRN        10/27/21 1234    oxyCODONE-acetaminophen (PERCOCET) 5-325 MG tablet  Every 4 hours PRN        10/27/21 1238             Note:  This document was prepared using  Dragon voice recognition software and may include unintentional dictation errors.    Dionne Bucy, MD 10/27/21 1240

## 2021-10-27 NOTE — ED Triage Notes (Signed)
BIB EMS from home. Pt has pain in left hip and down left leg. Lower leg is now numb but started last night at 930pm. Pt was seen last night for same and discharged. Pt was prescribed percocet last night as well. Pt had MRI yesterday and was clean. Pt did have back surgery 3/29 this year. CLeaned out L4&L5 and releived pressure from nerve pain.  Vitals 184/120 94 HR

## 2021-10-29 ENCOUNTER — Telehealth: Payer: Self-pay

## 2021-10-29 NOTE — Telephone Encounter (Signed)
He confirmed for 7/11 and W/C information in chart.

## 2021-10-29 NOTE — Telephone Encounter (Signed)
Please review. You operated on him in March 2023. Went to ER 7/8 for left leg pain and numbness. Had new MRI. This is worker's comp (lunged to catch a fan at work last week).

## 2021-10-29 NOTE — Telephone Encounter (Signed)
-----   Message from Rockey Situ sent at 10/29/2021  8:12 AM EDT ----- Regarding: ER fu Contact: [phone] ER on 7/8--previous sx with Dr.Yarbrough 07/18/2021 L4-5 POSTERIOR SPINAL DECOMPRESSION, LEFT L5-S1  MICRODISCECTOMY MRI lumbar spine:  IMPRESSION: 1. L5-S1 left paracentral herniation impinging on the left S1 nerve root. 2. L4-5 degeneration and anterolisthesis. Moderate spinal stenosis that is improved after decompression.   Consult with Dr.Yarbrough to follow up in the office, when should he be seen? He wants to be seen asap because he is not able to sit.  Patient said this is workman comp.

## 2021-10-30 ENCOUNTER — Other Ambulatory Visit: Payer: Self-pay

## 2021-10-30 ENCOUNTER — Ambulatory Visit: Payer: Worker's Compensation | Admitting: Neurosurgery

## 2021-10-30 ENCOUNTER — Other Ambulatory Visit: Payer: Self-pay | Admitting: Neurosurgery

## 2021-10-30 VITALS — BP 144/94 | Ht 70.0 in | Wt 200.0 lb

## 2021-10-30 DIAGNOSIS — M5416 Radiculopathy, lumbar region: Secondary | ICD-10-CM

## 2021-10-30 MED ORDER — OXYCODONE-ACETAMINOPHEN 5-325 MG PO TABS: 1.0000 | ORAL_TABLET | ORAL | 0 refills | Status: AC | PRN

## 2021-10-30 NOTE — Progress Notes (Signed)
Referring Physician:  No referring provider defined for this encounter.  Primary Physician:  Pcp, No  DOS 07/18/21 (L L4-5 decompression and L L5-S1 microdiscectomy)  History of Present Illness: 10/30/2021 [name] is here today with a chief complaint of severe leg pain and left leg numbness.  He was at work last week when he reached for a Wallace and that was falling.  He had immediate onset of left leg pain similar to what he had prior to surgery.  He has since been suffering from severe radicular pain down his left leg mostly down the back of his thigh and back of his calf.  He also has numbness in the lateral aspect of his foot.  He reports some functional weakness.  He denies bowel or bladder dysfunction.  He went to the emergency department on Saturday where an MRI scan showed a recurrent disc herniation L5-S1.   Bowel/Bladder Dysfunction: none  Conservative measures:  Physical therapy: None since surgery Multimodal medical therapy including regular antiinflammatories: Steroid taper initiated in the emergency department Injections: Has not had epidural steroid injections  Past Surgery: Please see above  Erik Wallace has no symptoms of cervical myelopathy.  The symptoms are causing a significant impact on the patient's life.   Review of Systems:  A 10 point review of systems is negative, except for the pertinent positives and negatives detailed in the HPI.  Past Medical History: Past Medical History:  Diagnosis Date   Back pain    Lumbar radiculopathy    Spinal stenosis at L4-L5 level     Past Surgical History: Past Surgical History:  Procedure Laterality Date   HERNIA REPAIR Left    inguinal hernia   L4-5 decompressio     Left L5-S1 discectomy     LUMBAR LAMINECTOMY/DECOMPRESSION MICRODISCECTOMY N/A 07/18/2021   Procedure: L4-5 POSTERIOR SPINAL DECOMPRESSION, LEFT L5-S1  MICRODISCECTOMY;  Surgeon: Erik Night, MD;  Location: [facility] ORS;  Service:  Neurosurgery;  Laterality: N/A;    Allergies: Allergies as of 10/30/2021   (No Known Allergies)    Medications: No outpatient medications have been marked as taking for the 10/30/21 encounter (Office Visit) with Erik Night, MD.    Social History: Social History   Tobacco Use   Smoking status: Never   Smokeless tobacco: Never  Vaping Use   Vaping Use: Never used  Substance Use Topics   Alcohol use: Yes    Alcohol/week: 3.0 standard drinks of alcohol    Types: 1 Glasses of wine, 1 Cans of beer, 1 Shots of liquor per week    Comment: occassionally   Drug use: Never    Family Medical History: Family History  Problem Relation Age of Onset   Heart disease Mother     Physical Examination: There were no vitals filed for this visit.  General: Patient is well developed, well nourished, calm, collected, and in no apparent distress. Attention to examination is appropriate.  Psychiatric: Patient is non-anxious.  Head:  Pupils equal, round, and reactive to light.  ENT:  Oral mucosa appears well hydrated.  Neck:   Supple.  Full range of motion.  Respiratory: Patient is breathing without any difficulty.  Extremities: No edema.  Vascular: Palpable dorsal pedal pulses.  Skin:   On exposed skin, there are no abnormal skin lesions.  NEUROLOGICAL:     Awake, alert, oriented to person, place, and time.  Speech is clear and fluent. Fund of knowledge is appropriate.   Cranial Nerves: Pupils equal  round and reactive to light.  Facial tone is symmetric.  Facial sensation is symmetric. Shoulder shrug is symmetric. Tongue protrusion is midline.  There is no pronator drift.    Strength: Side Biceps Triceps Deltoid Interossei Grip Wrist Ext. Wrist Flex.  R 5 5 5 5 5 5 5   L 5 5 5 5 5 5 5    Side Iliopsoas Quads Hamstring PF DF EHL  R 5 5 5 5 5 5   L 5 5 5 4 5 5    Reflexes are 1+ and symmetric at the biceps, triceps, brachioradialis, patella and achilles.   Hoffman's is  absent.  Clonus is not present.  Toes are down-going.  Bilateral upper and lower extremity sensation is intact to light touch.    No evidence of dysmetria noted.  Gait is abnormal-using crutches due to pain.     Medical Decision Making  Imaging: MRI L spine 10/27/21 IMPRESSION: 1. L5-S1 left paracentral herniation impinging on the left S1 nerve root. 2. L4-5 degeneration and anterolisthesis. Moderate spinal stenosis that is improved after decompression.     Electronically Signed   By: M.D.   On: 10/27/2021 10:26  I have personally reviewed the images and agree with the above interpretation.  Assessment and Plan: Mr. Hopfensperger is a pleasant 56 y.o. male with recurrent disc herniation causing left-sided S1 radiculopathy.  This occurred at work and his work-related injury.  We reviewed that most radiculopathies do improve with conservative management including physical therapy and injections.  I think it is reasonable to try a short course of this.  I will order physical therapy and an injection for him.  We will then see him back in 6 weeks.  He is not able to work due to pain.  I will keep him out of work until I see him back in clinic in August.  If he is not improving in August, we will discuss surgical intervention with recurrent microdiscectomy.   I spent a total of 30 minutes in face-to-face and non-face-to-face activities related to this patient's care today.  Thank you for involving me in the care of this patient.   This note was partially dictated using voice recognition software, so please excuse any errors that were not corrected.   Charice Zuno K. Beverely Pace MD, [facility] Neurosurgery

## 2021-10-30 NOTE — Progress Notes (Signed)
Meds refilled.

## 2021-10-30 NOTE — Telephone Encounter (Signed)
-----   Message from Rockey Situ sent at 10/30/2021  1:24 PM EDT ----- Regarding: pain med refill Contact: [phone] Can you call in Percocet? The ER only gave him enough for 3 days. [facility]

## 2021-10-31 ENCOUNTER — Telehealth: Payer: Self-pay

## 2021-10-31 MED ORDER — METHOCARBAMOL 500 MG PO TABS: 500.0000 mg | ORAL_TABLET | Freq: Four times a day (QID) | ORAL | 0 refills | Status: DC | PRN

## 2021-10-31 NOTE — Telephone Encounter (Signed)
Dr Myer Haff sent meds yesterday and Patty notified the pt this morning

## 2021-10-31 NOTE — Telephone Encounter (Signed)
-----   Message from Rockey Situ sent at 10/31/2021  8:11 AM EDT ----- Regarding: med refill Contact: [phone] He forgot to request a refill on prednisone.  He thinks that prednisone is a muscle relaxer. He states that he feels he can move a little better taking prednisone. [facility]

## 2021-10-31 NOTE — Telephone Encounter (Signed)
I spoke with Erik Wallace and explained that the prednisone is for inflammation and not for long term use. He will switch to over the counter antiinflammatories as needed after he finishes the prednisone. I explained that the methocarbamol (previously prescribed by our department) is a muscle relaxer. He has been taking this 4 times a day and requested a refill as he feels this is helping. A refill has been sent.

## 2021-11-07 ENCOUNTER — Ambulatory Visit
Payer: Worker's Compensation | Attending: Student in an Organized Health Care Education/Training Program | Admitting: Student in an Organized Health Care Education/Training Program

## 2021-11-07 ENCOUNTER — Encounter: Payer: Self-pay | Admitting: Student in an Organized Health Care Education/Training Program

## 2021-11-07 ENCOUNTER — Ambulatory Visit
Admission: RE | Admit: 2021-11-07 | Discharge: 2021-11-07 | Disposition: A | Discharge status: Home / Self Care | Payer: Self-pay | Source: Ambulatory Visit | Attending: Student in an Organized Health Care Education/Training Program | Admitting: Student in an Organized Health Care Education/Training Program

## 2021-11-07 ENCOUNTER — Ambulatory Visit: Payer: Commercial Managed Care - PPO | Admitting: Student in an Organized Health Care Education/Training Program

## 2021-11-07 VITALS — BP 174/112 | HR 83 | Temp 98.8°F | Resp 18 | Ht 70.5 in | Wt 194.0 lb

## 2021-11-07 DIAGNOSIS — M5417 Radiculopathy, lumbosacral region: Secondary | ICD-10-CM | POA: Insufficient documentation

## 2021-11-07 MED ORDER — LIDOCAINE HCL 2 % IJ SOLN
INTRAMUSCULAR | Status: AC
  Filled 2021-11-07: qty 20

## 2021-11-07 MED ORDER — IOHEXOL 180 MG/ML  SOLN
10.0000 mL | Freq: Once | INTRAMUSCULAR | Status: AC
Start: 2021-11-07 — End: 2021-11-07
  Administered 2021-11-07: 10 mL via EPIDURAL

## 2021-11-07 MED ORDER — SODIUM CHLORIDE (PF) 0.9 % IJ SOLN
INTRAMUSCULAR | Status: AC
  Filled 2021-11-07: qty 10

## 2021-11-07 MED ORDER — LIDOCAINE HCL 2 % IJ SOLN
20.0000 mL | Freq: Once | INTRAMUSCULAR | Status: AC
Start: 2021-11-07 — End: 2021-11-07
  Administered 2021-11-07: 400 mg

## 2021-11-07 MED ORDER — ROPIVACAINE HCL 2 MG/ML IJ SOLN
INTRAMUSCULAR | Status: AC
  Filled 2021-11-07: qty 20

## 2021-11-07 MED ORDER — IOHEXOL 180 MG/ML  SOLN
INTRAMUSCULAR | Status: AC
  Filled 2021-11-07: qty 20

## 2021-11-07 MED ORDER — DEXAMETHASONE SODIUM PHOSPHATE 10 MG/ML IJ SOLN
10.0000 mg | Freq: Once | INTRAMUSCULAR | Status: AC
  Administered 2021-11-07: 10 mg

## 2021-11-07 MED ORDER — DEXAMETHASONE SODIUM PHOSPHATE 10 MG/ML IJ SOLN
INTRAMUSCULAR | Status: AC
  Filled 2021-11-07: qty 1

## 2021-11-07 MED ORDER — ROPIVACAINE HCL 2 MG/ML IJ SOLN
1.0000 mL | Freq: Once | INTRAMUSCULAR | Status: AC
Start: 2021-11-07 — End: 2021-11-07
  Administered 2021-11-07: 20 mL via EPIDURAL

## 2021-11-07 NOTE — Progress Notes (Signed)
PROVIDER NOTE: Interpretation of information contained herein should be left to medically-trained personnel. Specific patient instructions are provided elsewhere under "Patient Instructions" section of medical record. This document was created in part using STT-dictation technology, any transcriptional errors that may result from this process are unintentional.  Patient: Erik Wallace Type: Established DOB: 1965/12/04 MRN: [id number] PCP: Pcp, No  Service: Procedure DOS: 11/07/2021 Setting: Ambulatory Location: Ambulatory outpatient facility Delivery: Face-to-face Provider: Edward Jolly, MD Specialty: Interventional Pain Management Specialty designation: 09 Location: Outpatient facility Ref. Prov.: Venetia Night, MD    Primary Reason for Visit: Interventional Pain Management Treatment. CC: Leg Pain (Left)   Procedure:           Type: Lumbar trans-foraminal epidural steroid injection (L-TFESI) #1  Laterality: Left (-LT)  Level: S1 nerve root(s) Imaging: Fluoroscopy-guided Anesthesia: Local anesthesia (1-2% Lidocaine) Anxiolysis: None                 Sedation: None. DOS: 11/07/2021  Performed by: Edward Jolly, MD  Purpose: Diagnostic/Therapeutic Indications: Lumbar radicular pain severe enough to impact quality of life or function. 1. Lumbosacral radiculopathy at S1 (left)    NAS-11 Pain score:   Pre-procedure: 10-Worst pain ever/10   Post-procedure: 8 /10     Position / Prep / Materials:  Position: Prone  Prep solution: DuraPrep (Iodine Povacrylex [0.7% available iodine] and Isopropyl Alcohol, 74% w/w) Prep Area: Entire Posterior Lumbosacral Area.  From the lower tip of the scapula down to the tailbone and from flank to flank. Materials:  Tray: Block Needle(s):  Type: Spinal  Gauge (G): 22  Length: 3.5-in  Qty: 1  Pre-op H&P Assessment:  Mr. Kleckner is a 56 y.o. (year old), male patient, seen today for interventional treatment. He  has a past surgical history  that includes Hernia repair (Left); Left L5-S1 discectomy; L4-5 decompressio; and Lumbar laminectomy/decompression microdiscectomy (N/A, 07/18/2021). Mr. Damiani has a current medication list which includes the following prescription(s): docusate sodium, ibuprofen, and methocarbamol. His primarily concern today is the Leg Pain (Left)  Initial Vital Signs:  Pulse/HCG Rate: 83ECG Heart Rate: 90 Temp: 98.8 F (37.1 C) Resp: 16 BP:  (!) 171/121 SpO2: 97 %  BMI: Estimated body mass index is 27.44 kg/m as calculated from the following:   Height as of this encounter: 5' 10.5" (1.791 m).   Weight as of this encounter: 194 lb (88 kg).  Risk Assessment: Allergies: Reviewed. He has No Known Allergies.  Allergy Precautions: None required Coagulopathies: Reviewed. None identified.  Blood-thinner therapy: None at this time Active Infection(s): Reviewed. None identified. Mr. Hustead is afebrile  Site Confirmation: Mr. Accardo was asked to confirm the procedure and laterality before marking the site Procedure checklist: Completed Consent: Before the procedure and under the influence of no sedative(s), amnesic(s), or anxiolytics, the patient was informed of the treatment options, risks and possible complications. To fulfill our ethical and legal obligations, as recommended by the American Medical Association's Code of Ethics, I have informed the patient of my clinical impression; the nature and purpose of the treatment or procedure; the risks, benefits, and possible complications of the intervention; the alternatives, including doing nothing; the risk(s) and benefit(s) of the alternative treatment(s) or procedure(s); and the risk(s) and benefit(s) of doing nothing. The patient was provided information about the general risks and possible complications associated with the procedure. These may include, but are not limited to: failure to achieve desired goals, infection, bleeding, organ or nerve damage, allergic  reactions, paralysis, and death. In addition,  the patient was informed of those risks and complications associated to Spine-related procedures, such as failure to decrease pain; infection (i.e.: Meningitis, epidural or intraspinal abscess); bleeding (i.e.: epidural hematoma, subarachnoid hemorrhage, or any other type of intraspinal or peri-dural bleeding); organ or nerve damage (i.e.: Any type of peripheral nerve, nerve root, or spinal cord injury) with subsequent damage to sensory, motor, and/or autonomic systems, resulting in permanent pain, numbness, and/or weakness of one or several areas of the body; allergic reactions; (i.e.: anaphylactic reaction); and/or death. Furthermore, the patient was informed of those risks and complications associated with the medications. These include, but are not limited to: allergic reactions (i.e.: anaphylactic or anaphylactoid reaction(s)); adrenal axis suppression; blood sugar elevation that in diabetics may result in ketoacidosis or comma; water retention that in patients with history of congestive heart failure may result in shortness of breath, pulmonary edema, and decompensation with resultant heart failure; weight gain; swelling or edema; medication-induced neural toxicity; particulate matter embolism and blood vessel occlusion with resultant organ, and/or nervous system infarction; and/or aseptic necrosis of one or more joints. Finally, the patient was informed that Medicine is not an exact science; therefore, there is also the possibility of unforeseen or unpredictable risks and/or possible complications that may result in a catastrophic outcome. The patient indicated having understood very clearly. We have given the patient no guarantees and we have made no promises. Enough time was given to the patient to ask questions, all of which were answered to the patient's satisfaction. Mr. Lant has indicated that he wanted to continue with the procedure. Attestation: I,  the ordering provider, attest that I have discussed with the patient the benefits, risks, side-effects, alternatives, likelihood of achieving goals, and potential problems during recovery for the procedure that I have provided informed consent. Date  Time: 11/07/2021 12:29 PM  Pre-Procedure Preparation:  Monitoring: As per clinic protocol. Respiration, ETCO2, SpO2, BP, heart rate and rhythm monitor placed and checked for adequate function Safety Precautions: Patient was assessed for positional comfort and pressure points before starting the procedure. Time-out: I initiated and conducted the "Time-out" before starting the procedure, as per protocol. The patient was asked to participate by confirming the accuracy of the "Time Out" information. Verification of the correct person, site, and procedure were performed and confirmed by me, the nursing staff, and the patient. "Time-out" conducted as per Joint Commission's Universal Protocol (UP.01.01.01). Time: 1305  Description/Narrative of Procedure:          Target: The 6 o'clock position under the pedicle, on the affected side. Region: Posterolateral Lumbosacral Approach: Posterior Percutaneous Paravertebral approach.  Rationale (medical necessity): procedure needed and proper for the diagnosis and/or treatment of the patient's medical symptoms and needs. Procedural Technique Safety Precautions: Aspiration looking for blood return was conducted prior to all injections. At no point did we inject any substances, as a needle was being advanced. No attempts were made at seeking any paresthesias. Safe injection practices and needle disposal techniques used. Medications properly checked for expiration dates. SDV (single dose vial) medications used. Description of the Procedure: Protocol guidelines were followed. The patient was placed in position over the procedure table. The target area was identified and the area prepped in the usual manner. Skin & deeper  tissues infiltrated with local anesthetic. Appropriate amount of time allowed to pass for local anesthetics to take effect. The procedure needles were then advanced to the target area. Proper needle placement secured. Negative aspiration confirmed. Solution injected in intermittent fashion, asking for systemic  symptoms every 0.5cc of injectate. The needles were then removed and the area cleansed, making sure to leave some of the prepping solution back to take advantage of its long term bactericidal properties.  2.5 cc solution made of 1.5 cc of 0.2% ropivacaine, 1 cc of Decadron 10 mg/cc injected at left S1 nerve after contrast confirmation    Vitals:   11/07/21 1235 11/07/21 1306 11/07/21 1311 11/07/21 1316  BP: (!) 171/121 (!) 170/103 (!) 163/114 (!) 174/112  Pulse: 83     Resp: 16 20 20 18   Temp: 98.8 F (37.1 C)     TempSrc: Temporal     SpO2: 97% 95% 95% 96%  Weight: 194 lb (88 kg)     Height: 5' 10.5" (1.791 m)       Start Time: 1306 hrs. End Time: 1313 hrs.  Imaging Guidance (Spinal):          Type of Imaging Technique: Fluoroscopy Guidance (Spinal) Indication(s): Assistance in needle guidance and placement for procedures requiring needle placement in or near specific anatomical locations not easily accessible without such assistance. Exposure Time: Please see nurses notes. Contrast: Before injecting any contrast, we confirmed that the patient did not have an allergy to iodine, shellfish, or radiological contrast. Once satisfactory needle placement was completed at the desired level, radiological contrast was injected. Contrast injected under live fluoroscopy. No contrast complications. See chart for type and volume of contrast used. Fluoroscopic Guidance: I was personally present during the use of fluoroscopy. "Tunnel Vision Technique" used to obtain the best possible view of the target area. Parallax error corrected before commencing the procedure. "Direction-depth-direction"  technique used to introduce the needle under continuous pulsed fluoroscopy. Once target was reached, antero-posterior, oblique, and lateral fluoroscopic projection used confirm needle placement in all planes. Images permanently stored in EMR. Interpretation: I personally interpreted the imaging intraoperatively. Adequate needle placement confirmed in multiple planes. Appropriate spread of contrast into desired area was observed. No evidence of afferent or efferent intravascular uptake. No intrathecal or subarachnoid spread observed. Permanent images saved into the patient's record.  Antibiotic Prophylaxis:   Anti-infectives (From admission, onward)    None      Indication(s): None identified  Post-operative Assessment:  Post-procedure Vital Signs:  Pulse/HCG Rate: 8385 Temp: 98.8 F (37.1 C) Resp: 18 BP:  (!) 174/112 SpO2: 96 %  EBL: None  Complications: No immediate post-treatment complications observed by team, or reported by patient.  Note: The patient tolerated the entire procedure well. A repeat set of vitals were taken after the procedure and the patient was kept under observation following institutional policy, for this type of procedure. Post-procedural neurological assessment was performed, showing return to baseline, prior to discharge. The patient was provided with post-procedure discharge instructions, including a section on how to identify potential problems. Should any problems arise concerning this procedure, the patient was given instructions to immediately contact , at any time, without hesitation. In any case, we plan to contact the patient by telephone for a follow-up status report regarding this interventional procedure.  Comments:  No additional relevant information.  Plan of Care  Continue with physical therapy, follow-up in 3 weeks for postprocedural evaluation.   Orders:  Orders Placed This Encounter  Procedures   DG PAIN CL[facility] C-ARM 1-60 MIN NO REPORT     Intraoperative interpretation by procedural physician at [facility].    Standing Status:   Standing    Number of Occurrences:   1    Order Specific  Question:   Reason for exam:    Answer:   Assistance in needle guidance and placement for procedures requiring needle placement in or near specific anatomical locations not easily accessible without such assistance.     Medications ordered for procedure: Meds ordered this encounter  Medications   iohexol (OMNIPAQUE) 180 MG/ML injection 10 mL    Must be Myelogram-compatible. If not available, you may substitute with a water-soluble, non-ionic, hypoallergenic, myelogram-compatible radiological contrast medium.   lidocaine (XYLOCAINE) 2 % (with pres) injection 400 mg   ropivacaine (PF) 2 mg/mL (0.2%) (NAROPIN) injection 1 mL   dexamethasone (DECADRON) injection 10 mg   Medications administered: We administered iohexol, lidocaine, ropivacaine (PF) 2 mg/mL (0.2%), and dexamethasone.  See the medical record for exact dosing, route, and time of administration.  Follow-up plan:   Return in about 3 weeks (around 11/28/2021) for PPE VV.       Left S1 TF ESI 11/07/21    Recent Visits No visits were found meeting these conditions. Showing recent visits within past 90 days and meeting all other requirements Today's Visits Date Type Provider Dept  11/07/21 Office Visit Edward Jolly, MD [facility]  Showing today's visits and meeting all other requirements Future Appointments Date Type Provider Dept  11/28/21 Appointment Edward Jolly, MD [facility]  Showing future appointments within next 90 days and meeting all other requirements  Disposition: Discharge home  Discharge (Date  Time): 11/07/2021; 1325 hrs.   Primary Care Physician: Pcp, No Location: [facility] Note by: Edward Jolly, MD Date: 11/07/2021; Time: 2:49 PM  Disclaimer:  Medicine is not an exact science. The only  guarantee in medicine is that nothing is guaranteed. It is important to note that the decision to proceed with this intervention was based on the information collected from the patient. The Data and conclusions were drawn from the patient's questionnaire, the interview, and the physical examination. Because the information was provided in large part by the patient, it cannot be guaranteed that it has not been purposely or unconsciously manipulated. Every effort has been made to obtain as much relevant data as possible for this evaluation. It is important to note that the conclusions that lead to this procedure are derived in large part from the available data. Always take into account that the treatment will also be dependent on availability of resources and existing treatment guidelines, considered by other Pain Management Practitioners as being common knowledge and practice, at the time of the intervention. For Medico-Legal purposes, it is also important to point out that variation in procedural techniques and pharmacological choices are the acceptable norm. The indications, contraindications, technique, and results of the above procedure should only be interpreted and judged by a Board-Certified Interventional Pain Specialist with extensive familiarity and expertise in the same exact procedure and technique.

## 2021-11-07 NOTE — Progress Notes (Signed)
PROVIDER NOTE: Information contained herein reflects review and annotations entered in association with encounter. Interpretation of such information and data should be left to medically-trained personnel. Information provided to patient can be located elsewhere in the medical record under "Patient Instructions". Document created using STT-dictation technology, any transcriptional errors that may result from process are unintentional.    Patient: Erik Wallace  Service: Procedure (FAST-TRACK REFERRAL)  Provider: Gillis Santa, MD  DOB: [birth year]  DOS: 11/07/2021  Specialty: Interventional Pain Management  MRN: [id number]  Setting: Ambulatory outpatient  Referring Provider: Meade Maw, MD  Type: New Patient  Location: Pain clinic facility  office  PCP: Pcp, No  NOTE: FAST-TRACK referrals are offered to accelerate the care of patients in the need of specific interventional pain management therapies. This type of referral does not include non-interventional pain management options.   Fast Track Procedure  Primary Reason for Visit: Interventional Pain Management Treatment. CC: Leg Pain (Left)  HPI  [name] is a 56 y.o. year old, male patient, who comes today for a  "Fast-Track" new patient evaluation, as requested by Meade Maw, MD. The patient has been made aware that this type of referral option is reserved for the Interventional Pain Management portion of our practice and completely excludes the option of medication management. His primarily concern today is the Leg Pain (Left)  Pain Assessment: Location: Left Leg Radiating: Left buttock down left leg to include all toes Onset: More than a month ago Duration: Chronic pain Quality: Numbness Severity: 10-Worst pain ever/10 (subjective, self-reported pain score)  Effect on ADL: "I go from bed to the couch all day"; "I cannot stand up all the way" Timing: Constant Modifying factors: nothing helps BP: (!) 174/112  HR:  83  The patient comes into the clinics today, referred to [place] for a left S1 transforaminal epidural steroid injection from Dr. Elayne Guerin with neurosurgery.  Briefly, patient is a pleasant 56 year old male who earlier this month was working when he reached for a light that was falling and had immediate onset of low back and radiating left leg pain.  Of note patient has a history of left L4-L5 decompression as well as left L5-S1 microdiscectomy on 07/18/2021.  He states that he was doing very well after his surgery until this work place accident.  He has difficulty ambulating and is in severe, 10 out of 10 pain.  He is working with physical therapy.  He has been evaluated by [name] who recommends physical therapy and left S1 transforaminal injection.  MRI results are below.  Meds   Current Outpatient Medications:    Docusate Sodium (COLACE PO), Take 1 tablet by mouth as needed (constipation)., Disp: , Rfl:    ibuprofen (ADVIL) 600 MG tablet, Take 1 tablet (600 mg total) by mouth every 6 (six) hours as needed., Disp: 30 tablet, Rfl: 0   methocarbamol (ROBAXIN) 500 MG tablet, Take 1 tablet (500 mg total) by mouth 4 (four) times daily as needed for muscle spasms., Disp: 120 tablet, Rfl: 0  Imaging Review  Cervical Imaging: Cervical MR wo contrast: Results for orders placed during the hospital encounter of 07/11/21  MR CERVICAL SPINE WO CONTRAST  Narrative CLINICAL DATA:  Abnormal reflexes.  EXAM: MRI CERVICAL SPINE WITHOUT CONTRAST  TECHNIQUE: Multiplanar, multisequence MR imaging of the cervical spine was performed. No intravenous contrast was administered.  COMPARISON:  None.  FINDINGS: Alignment: Physiologic.  Vertebrae: No acute fracture, evidence of discitis, or aggressive bone lesion.  Cord: Normal signal  and morphology.  Posterior Fossa, vertebral arteries, paraspinal tissues: Posterior fossa demonstrates no focal abnormality. Vertebral artery flow voids are  maintained. Paraspinal soft tissues are unremarkable.  Disc levels:  Discs: Disc spaces are relatively well maintained.  C2-3: No significant disc bulge. No neural foraminal stenosis. No central canal stenosis.  C3-4: No significant disc bulge. No right foraminal stenosis. Minimal left foraminal stenosis. No spinal stenosis.  C4-5: No significant disc bulge. No right foraminal stenosis. Mild left foraminal stenosis. No spinal stenosis.  C5-6: Minimal broad-based disc bulge. Mild right and moderate left foraminal stenosis. No spinal stenosis.  C6-7: No significant disc bulge. No neural foraminal stenosis. No central canal stenosis.  C7-T1: No significant disc bulge. No neural foraminal stenosis. No central canal stenosis.  IMPRESSION: 1. Mild cervical spine spondylosis as described above. 2. No focal abnormality of the cervical spinal cord. 3.  No acute osseous injury of the cervical spine.   Electronically Signed By: Kathreen Devoid M.D. On: 07/14/2021 07:06  Lumbosacral Imaging: Lumbar MR wo contrast: Results for orders placed during the hospital encounter of 10/27/21  MR LUMBAR SPINE WO CONTRAST  Narrative CLINICAL DATA:  Lumbar radiculopathy. Pain in the left hip extending down the leg this started last night  EXAM: MRI LUMBAR SPINE WITHOUT CONTRAST  TECHNIQUE: Multiplanar, multisequence MR imaging of the lumbar spine was performed. No intravenous contrast was administered.  COMPARISON:  06/14/2021  FINDINGS: Segmentation:  5 lumbar type vertebrae  Alignment:  Slight anterolisthesis at L4-5.  Vertebrae:  No fracture, evidence of discitis, or bone lesion.  Conus medullaris and cauda equina: Conus extends to the L1 level. Conus and cauda equina appear normal.  Paraspinal and other soft tissues: Negative for perispinal mass or inflammation. Postsurgical atrophy of lower left intrinsic back muscles.  Disc levels:  T12- L1: Spondylosis.  No neural  impingement  L1-L2: Spondylosis.  No neural impingement  L2-L3: Spondylitic spurring and mild disc space narrowing  L3-L4: Spondylitic spurring and disc bulging.  L4-L5: Degenerative facet spurring with mild anterolisthesis. The disc is narrowed and bulging with annular fissure. Moderate spinal stenosis which is improved. Biforaminal disc bulging and endplate/facet spurring causing noncompressive bilateral foraminal narrowing  L5-S1:Left paracentral herniation which is convincing even without post-contrast localization of postoperative granulation tissue. The left S1 nerve root is posteriorly displaced into the defect of the left laminotomy. Facet spurring and mild left foraminal narrowing.  IMPRESSION: 1. L5-S1 left paracentral herniation impinging on the left S1 nerve root. 2. L4-5 degeneration and anterolisthesis. Moderate spinal stenosis that is improved after decompression.   Electronically Signed By: Jorje Guild M.D. On: 10/27/2021 10:26  Narrative CLINICAL DATA:  L4-L5 and L5-S1 microdiscectomy  EXAM: LUMBAR SPINE - 2-3 VIEW  COMPARISON:  Lumbar spine MRI 06/14/2021  FINDINGS: 4 lateral C-arm fluoroscopic images were obtained intraoperatively and submitted for post operative interpretation. On the initial images, surgical hardware is noted posterior to the L5-S1 disc space. The next 2 images demonstrate hardware posterior to the L4-L5 disc space. Fluoro time 5 seconds. Please see the performing provider's procedural report for further detail.  IMPRESSION: Intraoperative localization images for L4-L5 and L5-S1 microdiscectomy.   Electronically Signed By: Valetta Mole M.D. On: 07/18/2021 08:26  Complexity Note: Imaging results reviewed. Results shared with Mr. Caratachea, using Layman's terms.                          Allergies  Mr. Raulston has No Known Allergies.  Laboratory Chemistry Profile   Renal Lab Results  Component Value Date   BUN 16  10/27/2021   CREATININE 1.05 10/27/2021   GFRNONAA >60 10/27/2021   PROTEINUR NEGATIVE 07/11/2021     Electrolytes Lab Results  Component Value Date   NA 137 10/27/2021   K 3.9 10/27/2021   CL 103 10/27/2021   CALCIUM 9.3 10/27/2021     Hepatic No results found for: "AST", "ALT", "ALBUMIN", "ALKPHOS", "AMYLASE", "LIPASE", "AMMONIA"   ID Lab Results  Component Value Date   HIV Non Reactive 07/18/2021   STAPHAUREUS NEGATIVE 07/11/2021   MRSAPCR NEGATIVE 07/11/2021     Bone No results found for: "VD25OH", "VD125OH2TOT", "MC9470JG2", "EZ6629UT6", "25OHVITD1", "25OHVITD2", "25OHVITD3", "TESTOFREE", "TESTOSTERONE"   Endocrine Lab Results  Component Value Date   GLUCOSE 129 (H) 10/27/2021   GLUCOSEU 50 (A) 07/11/2021   HGBA1C 5.9 (H) 07/18/2021     Neuropathy Lab Results  Component Value Date   HGBA1C 5.9 (H) 07/18/2021   HIV Non Reactive 07/18/2021     CNS No results found for: "COLORCSF", "APPEARCSF", "RBCCOUNTCSF", "WBCCSF", "POLYSCSF", "LYMPHSCSF", "EOSCSF", "PROTEINCSF", "GLUCCSF", "JCVIRUS", "CSFOLI", "IGGCSF", "LABACHR", "ACETBL"   Inflammation (CRP: Acute  ESR: Chronic) No results found for: "CRP", "ESRSEDRATE", "LATICACIDVEN"   Rheumatology No results found for: "RF", "ANA", "LABURIC", "URICUR", "LYMEIGGIGMAB", "LYMEABIGMQN", "HLAB27"   Coagulation Lab Results  Component Value Date   INR 1.0 07/11/2021   LABPROT 13.3 07/11/2021   APTT 29 07/11/2021   PLT 250 10/27/2021     Cardiovascular Lab Results  Component Value Date   HGB 16.8 10/27/2021   HCT 48.5 10/27/2021     Screening Lab Results  Component Value Date   STAPHAUREUS NEGATIVE 07/11/2021   MRSAPCR NEGATIVE 07/11/2021   HIV Non Reactive 07/18/2021     Cancer No results found for: "CEA", "CA125", "LABCA2"   Allergens No results found for: "ALMOND", "APPLE", "ASPARAGUS", "AVOCADO", "BANANA", "BARLEY", "BASIL", "BAYLEAF", "GREENBEAN", "LIMABEAN", "WHITEBEAN", "BEEFIGE", "REDBEET",  "BLUEBERRY", "BROCCOLI", "CABBAGE", "MELON", "CARROT", "CASEIN", "CASHEWNUT", "CAULIFLOWER", "CELERY"     Note: Lab results reviewed.  [place]  Drug: Mr. Belluomini  reports no history of drug use. Alcohol:  reports current alcohol use of about 3.0 standard drinks of alcohol per week. Tobacco:  reports that he has never smoked. He has never used smokeless tobacco. Medical:  has a past medical history of Back pain, Lumbar radiculopathy, and Spinal stenosis at L4-L5 level. Family: family history includes Heart disease in his mother.  Past Surgical History:  Procedure Laterality Date   HERNIA REPAIR Left    inguinal hernia   L4-5 decompressio     Left L5-S1 discectomy     LUMBAR LAMINECTOMY/DECOMPRESSION MICRODISCECTOMY N/A 07/18/2021   Procedure: L4-5 POSTERIOR SPINAL DECOMPRESSION, LEFT L5-S1  MICRODISCECTOMY;  Surgeon: Meade Maw, MD;  Location: [facility] ORS;  Service: Neurosurgery;  Laterality: N/A;   Active Ambulatory Problems    Diagnosis Date Noted   Hypertensive urgency 07/18/2021   Lumbar radiculopathy 07/18/2021   Spinal stenosis at L4-L5 level 07/18/2021   Resolved Ambulatory Problems    Diagnosis Date Noted   No Resolved Ambulatory Problems   Past Medical History:  Diagnosis Date   Back pain    Constitutional Exam  General appearance: Well nourished, well developed, and well hydrated. In no apparent acute distress Vitals:   11/07/21 1235 11/07/21 1306 11/07/21 1311 11/07/21 1316  BP: (!) 171/121 (!) 170/103 (!) 163/114 (!) 174/112  Pulse: 83     Resp: _0 Temp: 98.8 F (  37.1 C)     TempSrc: Temporal     SpO2: 97% 95% 95% 96%  Weight: 194 lb (88 kg)     Height: 5' 10.5" (1.791 m)      BMI Assessment: Estimated body mass index is 27.44 kg/m as calculated from the following:   Height as of this encounter: 5' 10.5" (1.791 m).   Weight as of this encounter: 194 lb (88 kg).  BMI interpretation table: BMI level Category Range association with higher  incidence of chronic pain  <18 kg/m2 Underweight   18.5-24.9 kg/m2 Ideal body weight   25-29.9 kg/m2 Overweight Increased incidence by 20%  30-34.9 kg/m2 Obese (Class I) Increased incidence by 68%  35-39.9 kg/m2 Severe obesity (Class II) Increased incidence by 136%  >40 kg/m2 Extreme obesity (Class III) Increased incidence by 254%   Patient's current BMI Ideal Body weight  Body mass index is 27.44 kg/m. Ideal body weight: 74.1 kg (163 lb 7.5 oz) Adjusted ideal body weight: 79.7 kg (175 lb 10.9 oz)   BMI Readings from Last 4 Encounters:  11/07/21 27.44 kg/m  10/30/21 28.70 kg/m  10/27/21 28.70 kg/m  07/18/21 29.75 kg/m   Wt Readings from Last 4 Encounters:  11/07/21 194 lb (88 kg)  10/30/21 200 lb (90.7 kg)  10/27/21 200 lb (90.7 kg)  07/18/21 210 lb 5.1 oz (95.4 kg)    Psych/Mental status: Alert, oriented x 3 (person, place, & time)       Eyes: PERLA Respiratory: No evidence of acute respiratory distress  Lumbar Exam  Skin & Axial Inspection: No masses, redness, or swelling Alignment: Symmetrical Functional ROM: Pain restricted ROM affecting primarily the left Stability: No instability detected Muscle Tone/Strength: Functionally intact. No obvious neuro-muscular anomalies detected. Sensory (Neurological): Dermatomal pain pattern, left S1   Gait & Posture Assessment  Ambulation: Unassisted Gait: Antalgic gait (limping) Posture: WNL   Lower Extremity Exam    Side: Right lower extremity  Side: Left lower extremity  Stability: No instability observed          Stability: No instability observed          Skin & Extremity Inspection: Skin color, temperature, and hair growth are WNL. No peripheral edema or cyanosis. No masses, redness, swelling, asymmetry, or associated skin lesions. No contractures.  Skin & Extremity Inspection: Skin color, temperature, and hair growth are WNL. No peripheral edema or cyanosis. No masses, redness, swelling, asymmetry, or associated skin  lesions. No contractures.  Functional ROM: Pain restricted ROM for hip and knee joints          Functional ROM: Unrestricted ROM                  Muscle Tone/Strength: Functionally intact. No obvious neuro-muscular anomalies detected.  Muscle Tone/Strength: Functionally intact. No obvious neuro-muscular anomalies detected.  Sensory (Neurological): Dermatomal pain pattern        Sensory (Neurological): Unimpaired        DTR: Patellar: deferred today Achilles: deferred today Plantar: deferred today  DTR: Patellar: deferred today Achilles: deferred today Plantar: deferred today  Palpation: No palpable anomalies  Palpation: No palpable anomalies   Procedure  Plan for left S1 transforaminal ESI, see attached procedure note.

## 2021-11-07 NOTE — Progress Notes (Signed)
Safety precautions to be maintained throughout the outpatient stay will include: orient to surroundings, keep bed in low position, maintain call bell within reach at all times, provide assistance with transfer out of bed and ambulation.  

## 2021-11-07 NOTE — Patient Instructions (Signed)

## 2021-11-22 ENCOUNTER — Telehealth: Payer: Self-pay

## 2021-11-22 NOTE — Telephone Encounter (Signed)
The patient called and stated that he has been going to PT and also had a ESI by Dr. Cherylann Ratel. He has been having pain on the left side but now he has started having extreme pain on the right side and going down the right leg. PT wants him to be re-evaluated before they will continue with PT. His next follow up appt is on 12/11/21, do you want to work him in sooner?

## 2021-11-27 ENCOUNTER — Encounter: Payer: Self-pay | Admitting: Student in an Organized Health Care Education/Training Program

## 2021-11-27 NOTE — Telephone Encounter (Signed)
Patient has been scheduled for appt on 11/30/21 at 1:00pm. Patient verbalized understanding.

## 2021-11-28 ENCOUNTER — Ambulatory Visit
Payer: Commercial Managed Care - PPO | Attending: Student in an Organized Health Care Education/Training Program | Admitting: Student in an Organized Health Care Education/Training Program

## 2021-11-28 DIAGNOSIS — M5417 Radiculopathy, lumbosacral region: Secondary | ICD-10-CM | POA: Diagnosis not present

## 2021-11-28 NOTE — Progress Notes (Signed)
Patient: Erik Wallace  Service Category: E/Erik  Provider: Gillis Santa, MD  DOB: 1965-11-09  DOS: 11/28/2021  Location: Office  MRN: [id number]  Setting: Ambulatory outpatient  Referring Provider: No ref. provider found  Type: Established Patient  Specialty: Interventional Pain Management  PCP: Pcp, No  Location: Remote location  Delivery: TeleHealth     Virtual Encounter - Pain Management PROVIDER NOTE: Information contained herein reflects review and annotations entered in association with encounter. Interpretation of such information and data should be left to medically-trained personnel. Information provided to patient can be located elsewhere in the medical record under "Patient Instructions". Document created using STT-dictation technology, any transcriptional errors that may result from process are unintentional.    Contact & Pharmacy Preferred: [phone] Home: [phone] (home) Mobile: [phone] (mobile) E-mail: [email or web address]  [id number] [place], [place] - [email or web address]: [phone]: [phone]  Pre-screening  Erik Wallace "in-person" vs "virtual" encounter. He indicated preferring virtual for this encounter.   Reason COVID-19*  Social distancing based on CDC and AMA recommendations.   I contacted Erik Jackon 11/28/2021 via telephone.      I clearly identified myself as Erik Santa MD. I verified that I was speaking with the correct person using two identifiers (Name: Erik Wallace and date of birth: 119-Feb-1967.  Consent I sought verbal advanced consent from Erik Jackfor virtual visit interactions. I informed Mr. BPennypackerof possible security and privacy concerns, risks, and limitations associated with providing "not-in-person" medical evaluation and management services. I also informed Mr. BReaserof the availability of "in-person" appointments. Finally, I informed him that there would  be a charge for the virtual visit and that he could be  personally, fully or partially, financially responsible for it. [name] understanding and agreed to proceed.   Historic Elements   Erik Wallace a 56y.o. year old, male patient evaluated today after our last contact on 11/07/2021. Erik Wallace has a past medical history of Back pain, Lumbar radiculopathy, and Spinal stenosis at L4-L5 level. He also  has a past surgical history that includes Hernia repair (Left); Left L5-S1 discectomy; L4-5 decompressio; and Lumbar laminectomy/decompression microdiscectomy (N/A, 07/18/2021). Mr. BStrykerhas a current medication list which includes the following prescription(s): docusate sodium, ibuprofen, and methocarbamol. He  reports that he has never smoked. He has never used smokeless tobacco. He reports current alcohol use of about 3.0 standard drinks of alcohol per week. He reports that he does not use drugs. [name] No Known Allergies.   HPI  Today, he is being contacted for a post-procedure assessment.   Post-procedure evaluation   Type: Lumbar trans-foraminal epidural steroid injection (L-TFESI) #1  Laterality: Left (-LT)  Level: S1 nerve root(s) Imaging: Fluoroscopy-guided Anesthesia: Local anesthesia (1-2% Lidocaine) Anxiolysis: None                 Sedation: None. DOS: 11/07/2021  Performed by: Erik Santa MD  Purpose: Diagnostic/Therapeutic Indications: Lumbar radicular pain severe enough to impact quality of life or function. 1. Lumbosacral radiculopathy at S1 (left)    NAS-11 Pain score:   Pre-procedure: 10-Worst pain ever/10   Post-procedure: 8 /10      Effectiveness:  Initial hour after procedure: 50% Subsequent 4-6 hours post-procedure: 50% Analgesia past initial 6 hours: 50% Ongoing improvement:  Analgesic:  30-35% Function: Somewhat improved ROM: Somewhat improved   Laboratory Chemistry Profile  Renal Lab Results  Component Value Date    BUN 16 10/27/2021   CREATININE 1.05 10/27/2021   GFRNONAA >60 10/27/2021    Hepatic No results found for: "AST", "ALT", "ALBUMIN", "ALKPHOS", "HCVAB", "AMYLASE", "LIPASE", "AMMONIA"  Electrolytes Lab Results  Component Value Date   NA 137 10/27/2021   K 3.9 10/27/2021   CL 103 10/27/2021   CALCIUM 9.3 10/27/2021    Bone No results found for: "VD25OH", "VD125OH2TOT", "UO3729MS1", "JD5520EY2", "25OHVITD1", "25OHVITD2", "25OHVITD3", "TESTOFREE", "TESTOSTERONE"  Inflammation (CRP: Acute Phase) (ESR: Chronic Phase) No results found for: "CRP", "ESRSEDRATE", "LATICACIDVEN"       Note: Above Lab results reviewed.  Assessment  The encounter diagnosis was Lumbosacral radiculopathy at S1 (left).  Plan of Care  [name] obtained some benefit after his left S1 transforaminal epidural steroid injection.  He states that he has been working with physical therapy.  He states that he has been having increased right-sided buttock and leg pain on occasion.  He believes that this is more musculoskeletal.  He has a follow-up appointment with [name] on Friday.  Follow-up as needed for repeat injection or per recommendation of [name]..  Continue with physical therapy for the time being.    Follow-up plan:   Return for PRN-repeat injection.     Left S1 TF ESI 11/07/21     Recent Visits Date Type Provider Dept  11/07/21 Office Visit Gillis Santa, MD [facility]  Showing recent visits within past 90 days and meeting all other requirements Today's Visits Date Type Provider Dept  11/28/21 Office Visit Gillis Santa, MD [facility]  Showing today's visits and meeting all other requirements Future Appointments No visits were found meeting these conditions. Showing future appointments within next 90 days and meeting all other requirements  I discussed the assessment and treatment plan with the patient. The patient was provided an opportunity to ask questions and all  were answered. The patient agreed with the plan and demonstrated an understanding of the instructions.  Patient advised to call back or seek an in-person evaluation if the symptoms or condition worsens.  Duration of encounter: 19mnutes.  Note by: Erik Santa MD Date: 11/28/2021; Time: 1:13 PM

## 2021-11-30 ENCOUNTER — Ambulatory Visit (INDEPENDENT_AMBULATORY_CARE_PROVIDER_SITE_OTHER): Payer: Worker's Compensation | Admitting: Neurosurgery

## 2021-11-30 ENCOUNTER — Encounter: Payer: Self-pay | Admitting: Neurosurgery

## 2021-11-30 VITALS — BP 161/110 | HR 95 | Ht 70.0 in | Wt 201.0 lb

## 2021-11-30 DIAGNOSIS — M79604 Pain in right leg: Secondary | ICD-10-CM

## 2021-11-30 DIAGNOSIS — M5416 Radiculopathy, lumbar region: Secondary | ICD-10-CM

## 2021-11-30 DIAGNOSIS — G5701 Lesion of sciatic nerve, right lower limb: Secondary | ICD-10-CM

## 2021-11-30 NOTE — Progress Notes (Signed)
Referring Physician:  No referring provider defined for this encounter.  Primary Physician:  Pcp, No  DOS 07/18/21 (L L4-5 decompression and L L5-S1 microdiscectomy)  History of Present Illness: 11/30/2021 Erik Wallace returns to see me.  His left leg symptoms from his recurrent disc herniation are improving.  He still has numbness but it is not bothering him as much.  His primary issue is right leg pain in his lateral upper right thigh.  It is very focal and does not extend down his leg.  It is greatly impacting his ability to walk and change positions.  He has been working with physical therapy and dry needling, which has helped but he remains in significant pain.  10/30/21 Erik Wallace is here today with a chief complaint of severe leg pain and left leg numbness.  He was at work last week when he reached for a night and that was falling.  He had immediate onset of left leg pain similar to what he had prior to surgery.  He has since been suffering from severe radicular pain down his left leg mostly down the back of his thigh and back of his calf.  He also has numbness in the lateral aspect of his foot.  He reports some functional weakness.  He denies bowel or bladder dysfunction.  He went to the emergency department on Saturday where an MRI scan showed a recurrent disc herniation L5-S1.   Bowel/Bladder Dysfunction: none  Conservative measures:  Physical therapy: None since surgery Multimodal medical therapy including regular antiinflammatories: Steroid taper initiated in the emergency department Injections: Has not had epidural steroid injections  Past Surgery: Please see above  Erik Wallace has no symptoms of cervical myelopathy.  The symptoms are causing a significant impact on the patient's life.   Review of Systems:  A 10 point review of systems is negative, except for the pertinent positives and negatives detailed in the HPI.  Past Medical History: Past Medical  History:  Diagnosis Date   Back pain    Lumbar radiculopathy    Spinal stenosis at L4-L5 level     Past Surgical History: Past Surgical History:  Procedure Laterality Date   HERNIA REPAIR Left    inguinal hernia   L4-5 decompressio     Left L5-S1 discectomy     LUMBAR LAMINECTOMY/DECOMPRESSION MICRODISCECTOMY N/A 07/18/2021   Procedure: L4-5 POSTERIOR SPINAL DECOMPRESSION, LEFT L5-S1  MICRODISCECTOMY;  Surgeon: Venetia Night, MD;  Location: [facility] ORS;  Service: Neurosurgery;  Laterality: N/A;    Allergies: Allergies as of 11/30/2021   (No Known Allergies)    Medications: No outpatient medications have been marked as taking for the 11/30/21 encounter (Office Visit) with Venetia Night, MD.    Social History: Social History   Tobacco Use   Smoking status: Never   Smokeless tobacco: Never  Vaping Use   Vaping Use: Never used  Substance Use Topics   Alcohol use: Yes    Alcohol/week: 3.0 standard drinks of alcohol    Types: 1 Glasses of wine, 1 Cans of beer, 1 Shots of liquor per week    Comment: occassionally   Drug use: Never    Family Medical History: Family History  Problem Relation Age of Onset   Heart disease Mother     Physical Examination: Vitals:   11/30/21 1300 11/30/21 1303  BP: (!) 161/134 (!) 161/110  Pulse: 94 95    General: Patient is well developed, well nourished, calm, collected, and in no  apparent distress. Attention to examination is appropriate.  Psychiatric: Patient is non-anxious.  Head:  Pupils equal, round, and reactive to light.  ENT:  Oral mucosa appears well hydrated.  Neck:   Supple.  Full range of motion.  Respiratory: Patient is breathing without any difficulty.  Extremities: No edema.  Vascular: Palpable dorsal pedal pulses.  Skin:   On exposed skin, there are no abnormal skin lesions.  NEUROLOGICAL:     Awake, alert, oriented to person, place, and time.  Speech is clear and fluent. Fund of knowledge is  appropriate.   Cranial Nerves: Pupils equal round and reactive to light.  Facial tone is symmetric.  Facial sensation is symmetric. Shoulder shrug is symmetric. Tongue protrusion is midline.  There is no pronator drift.    Strength: Side Biceps Triceps Deltoid Interossei Grip Wrist Ext. Wrist Flex.  R 5 5 5 5 5 5 5   L 5 5 5 5 5 5 5    Side Iliopsoas Quads Hamstring PF DF EHL  R 5 5 5 5 5 5   L 5 5 5  4+ 5 5   Reflexes are 1+ and symmetric at the biceps, triceps, brachioradialis, patella and achilles.   Hoffman's is absent.  Clonus is not present.  Toes are down-going.  Bilateral upper and lower extremity sensation is intact to light touch.    No evidence of dysmetria noted.  Gait is antalgic.     Medical Decision Making  Imaging: MRI L spine 10/27/21 IMPRESSION: 1. L5-S1 left paracentral herniation impinging on the left S1 nerve root. 2. L4-5 degeneration and anterolisthesis. Moderate spinal stenosis that is improved after decompression.     Electronically Signed   By: M.D.   On: 10/27/2021 10:26  I have personally reviewed the images and agree with the above interpretation.  Assessment and Plan: Erik Wallace is a pleasant 56 y.o. male with recurrent disc herniation causing left-sided S1 radiculopathy.  This occurred at work and his work-related injury.  His symptoms are improving from this.  He is also having pain in his right leg that may be due to piriformis syndrome.  He is working with physical therapy and dry needling, which is helping somewhat but he still has substantial pain.  There is no way he would be ready to return to work at this point.  I would like to reach out to Dr. Tiburcio Pea regarding a possible right-sided piriformis injection.  I will see him back in clinic in a couple of weeks as previously scheduled.  I spent a total of 10 minutes in face-to-face and non-face-to-face activities related to this patient's care today.  Thank you for  involving me in the care of this patient.   This note was partially dictated using voice recognition software, so please excuse any errors that were not corrected.   Dashanae Longfield K. 12/28/2021 MD, [facility] Neurosurgery

## 2021-12-11 ENCOUNTER — Encounter: Payer: Self-pay | Admitting: Neurosurgery

## 2021-12-11 ENCOUNTER — Ambulatory Visit (INDEPENDENT_AMBULATORY_CARE_PROVIDER_SITE_OTHER): Payer: Worker's Compensation | Admitting: Neurosurgery

## 2021-12-11 VITALS — BP 160/121 | HR 99 | Ht 70.0 in | Wt 201.1 lb

## 2021-12-11 DIAGNOSIS — M5416 Radiculopathy, lumbar region: Secondary | ICD-10-CM | POA: Diagnosis not present

## 2021-12-11 DIAGNOSIS — G5701 Lesion of sciatic nerve, right lower limb: Secondary | ICD-10-CM | POA: Diagnosis not present

## 2021-12-11 NOTE — Progress Notes (Signed)
Referring Physician:  Venetia Night, MD [address] [place],  [place] [address]  Primary Physician:  Pcp, No  DOS 07/18/21 (L L4-5 decompression and L L5-S1 microdiscectomy)  History of Present Illness: 12/11/2021 Erik Wallace continues to have R buttock pain.  His left leg continues to improve.  11/30/2021 Erik Wallace returns to see me.  His left leg symptoms from his recurrent disc herniation are improving.  He still has numbness but it is not bothering him as much.  His primary issue is right leg pain in his lateral upper right thigh.  It is very focal and does not extend down his leg.  It is greatly impacting his ability to walk and change positions.  He has been working with physical therapy and dry needling, which has helped but he remains in significant pain.  10/30/21 Erik Wallace is here today with a chief complaint of severe leg pain and left leg numbness.  He was at work last week when he reached for a night and that was falling.  He had immediate onset of left leg pain similar to what he had prior to surgery.  He has since been suffering from severe radicular pain down his left leg mostly down the back of his thigh and back of his calf.  He also has numbness in the lateral aspect of his foot.  He reports some functional weakness.  He denies bowel or bladder dysfunction.  He went to the emergency department on Saturday where an MRI scan showed a recurrent disc herniation L5-S1.   Bowel/Bladder Dysfunction: none  Conservative measures:  Physical therapy: None since surgery Multimodal medical therapy including regular antiinflammatories: Steroid taper initiated in the emergency department Injections: Has not had epidural steroid injections  Past Surgery: Please see above  Erik Wallace has no symptoms of cervical myelopathy.  The symptoms are causing a significant impact on the patient's life.   Review of Systems:  A 10 point review of systems is  negative, except for the pertinent positives and negatives detailed in the HPI.  Past Medical History: Past Medical History:  Diagnosis Date   Back pain    Lumbar radiculopathy    Spinal stenosis at L4-L5 level     Past Surgical History: Past Surgical History:  Procedure Laterality Date   HERNIA REPAIR Left    inguinal hernia   L4-5 decompressio     Left L5-S1 discectomy     LUMBAR LAMINECTOMY/DECOMPRESSION MICRODISCECTOMY N/A 07/18/2021   Procedure: L4-5 POSTERIOR SPINAL DECOMPRESSION, LEFT L5-S1  MICRODISCECTOMY;  Surgeon: Venetia Night, MD;  Location: [facility] ORS;  Service: Neurosurgery;  Laterality: N/A;    Allergies: Allergies as of 12/11/2021   (No Known Allergies)    Medications: No outpatient medications have been marked as taking for the 12/11/21 encounter (Office Visit) with Venetia Night, MD.    Social History: Social History   Tobacco Use   Smoking status: Never   Smokeless tobacco: Never  Vaping Use   Vaping Use: Never used  Substance Use Topics   Alcohol use: Yes    Alcohol/week: 3.0 standard drinks of alcohol    Types: 1 Glasses of wine, 1 Cans of beer, 1 Shots of liquor per week    Comment: occassionally   Drug use: Never    Family Medical History: Family History  Problem Relation Age of Onset   Heart disease Mother     Physical Examination: Vitals:   12/11/21 1517  BP: (!) 160/121  Pulse: 99    General: Patient is well developed, well nourished, calm, collected, and in no apparent distress. Attention to examination is appropriate.  Psychiatric: Patient is non-anxious.  Head:  Pupils equal, round, and reactive to light.  ENT:  Oral mucosa appears well hydrated.  Neck:   Supple.  Full range of motion.  Respiratory: Patient is breathing without any difficulty.  Extremities: No edema.  Vascular: Palpable dorsal pedal pulses.  Skin:   On exposed skin, there are no abnormal skin lesions.  NEUROLOGICAL:     Awake, alert,  oriented to person, place, and time.  Speech is clear and fluent. Fund of knowledge is appropriate.   Cranial Nerves: Pupils equal round and reactive to light.  Facial tone is symmetric.  Facial sensation is symmetric. Shoulder shrug is symmetric. Tongue protrusion is midline.  There is no pronator drift.    Strength: Side Biceps Triceps Deltoid Interossei Grip Wrist Ext. Wrist Flex.  R 5 5 5 5 5 5 5   L 5 5 5 5 5 5 5    Side Iliopsoas Quads Hamstring PF DF EHL  R 5 5 5 5 5 5   L 5 5 5  4+ 5 5   Reflexes are 1+ and symmetric at the biceps, triceps, brachioradialis, patella and achilles.   Hoffman's is absent.  Clonus is not present.  Toes are down-going.  Bilateral upper and lower extremity sensation is intact to light touch.    No evidence of dysmetria noted.  Gait is antalgic.     Medical Decision Making  Imaging: MRI L spine 10/27/21 IMPRESSION: 1. L5-S1 left paracentral herniation impinging on the left S1 nerve root. 2. L4-5 degeneration and anterolisthesis. Moderate spinal stenosis that is improved after decompression.     Electronically Signed   By: M.D.   On: 10/27/2021 10:26  I have personally reviewed the images and agree with the above interpretation.  Assessment and Plan: Erik Wallace is a pleasant 56 y.o. male with recurrent disc herniation causing left-sided S1 radiculopathy.  This occurred at work and his work-related injury.  His symptoms are improving from this.   He is also having pain in his right leg that may be due to piriformis syndrome.  He is working with physical therapy and dry needling, which is helping somewhat but he still has substantial pain.  There is no way he would be ready to return to work at this point.  We will have an injection with Dr. Tiburcio Pea tomorrow.  I have asked him to contact 12/28/2021 next week to see how he is doing.    I spent a total of 10 minutes in face-to-face and non-face-to-face activities related to this patient's  care today.  Thank you for involving me in the care of this patient.   This note was partially dictated using voice recognition software, so please excuse any errors that were not corrected.   Erik Wallace K. Beverely Pace MD, [facility] Neurosurgery

## 2021-12-12 ENCOUNTER — Other Ambulatory Visit: Payer: Self-pay

## 2021-12-12 ENCOUNTER — Encounter: Payer: Self-pay | Admitting: Student in an Organized Health Care Education/Training Program

## 2021-12-12 ENCOUNTER — Ambulatory Visit
Payer: Worker's Compensation | Attending: Student in an Organized Health Care Education/Training Program | Admitting: Student in an Organized Health Care Education/Training Program

## 2021-12-12 ENCOUNTER — Ambulatory Visit
Admission: RE | Admit: 2021-12-12 | Discharge: 2021-12-12 | Disposition: A | Discharge status: Home / Self Care | Payer: Commercial Managed Care - PPO | Source: Ambulatory Visit | Attending: Student in an Organized Health Care Education/Training Program | Admitting: Student in an Organized Health Care Education/Training Program

## 2021-12-12 VITALS — BP 178/119 | HR 88 | Temp 97.2°F | Resp 19 | Ht 70.0 in | Wt 195.0 lb

## 2021-12-12 DIAGNOSIS — G5701 Lesion of sciatic nerve, right lower limb: Secondary | ICD-10-CM | POA: Diagnosis present

## 2021-12-12 MED ORDER — LIDOCAINE HCL 2 % IJ SOLN
20.0000 mL | Freq: Once | INTRAMUSCULAR | Status: AC
Start: 2021-12-12 — End: 2021-12-12
  Administered 2021-12-12: 400 mg

## 2021-12-12 MED ORDER — DEXAMETHASONE SODIUM PHOSPHATE 10 MG/ML IJ SOLN
INTRAMUSCULAR | Status: AC
  Filled 2021-12-12: qty 1

## 2021-12-12 MED ORDER — ROPIVACAINE HCL 2 MG/ML IJ SOLN
4.0000 mL | Freq: Once | INTRAMUSCULAR | Status: AC
Start: 2021-12-12 — End: 2021-12-12
  Administered 2021-12-12: 4 mL via INTRA_ARTICULAR

## 2021-12-12 MED ORDER — ROPIVACAINE HCL 2 MG/ML IJ SOLN
INTRAMUSCULAR | Status: AC
  Filled 2021-12-12: qty 20

## 2021-12-12 MED ORDER — DEXAMETHASONE SODIUM PHOSPHATE 10 MG/ML IJ SOLN
10.0000 mg | Freq: Once | INTRAMUSCULAR | Status: AC
  Administered 2021-12-12: 10 mg

## 2021-12-12 MED ORDER — IOHEXOL 180 MG/ML  SOLN
INTRAMUSCULAR | Status: AC
  Filled 2021-12-12: qty 20

## 2021-12-12 MED ORDER — LIDOCAINE HCL 2 % IJ SOLN
INTRAMUSCULAR | Status: AC
  Filled 2021-12-12: qty 20

## 2021-12-12 MED ORDER — IOHEXOL 180 MG/ML  SOLN
10.0000 mL | Freq: Once | INTRAMUSCULAR | Status: AC
Start: 2021-12-12 — End: 2021-12-12
  Administered 2021-12-12: 10 mL via INTRA_ARTICULAR

## 2021-12-12 NOTE — Progress Notes (Signed)
Safety precautions to be maintained throughout the outpatient stay will include: orient to surroundings, keep bed in low position, maintain call bell within reach at all times, provide assistance with transfer out of bed and ambulation.  

## 2021-12-12 NOTE — Progress Notes (Signed)
PROVIDER NOTE: Interpretation of information contained herein should be left to medically-trained personnel. Specific patient instructions are provided elsewhere under "Patient Instructions" section of medical record. This document was created in part using STT-dictation technology, any transcriptional errors that may result from this process are unintentional.  Patient: Erik Wallace Type: Established DOB: [birth year] MRN: [id number] PCP: Pcp, No  Service: Procedure DOS: 12/12/2021 Setting: Ambulatory Location: Ambulatory outpatient facility Delivery: Face-to-face Provider: Edward Jolly, MD Specialty: Interventional Pain Management Specialty designation: 09 Location: Outpatient facility Ref. Prov.: No ref. provider found    Primary Reason for Visit: Interventional Pain Management Treatment. CC: Other (Buttocks pain, right piriformis )    Procedure:          Anesthesia, Analgesia, Anxiolysis:  Right trigger point injection under fluoroscopy.     1. Piriformis syndrome of right side    NAS-11 Pain score:   Pre-procedure: 5 /10   Post-procedure:0/10     Pre-op H&P Assessment:  Erik Wallace is a 56 y.o. (year old), male patient, seen today for interventional treatment. He  has a past surgical history that includes Hernia repair (Left); Left L5-S1 discectomy; L4-5 decompressio; and Lumbar laminectomy/decompression microdiscectomy (N/A, 07/18/2021). Erik Wallace currently has no medications in their medication list. His primarily concern today is the Other (Buttocks pain, right piriformis )  Initial Vital Signs:  Pulse/HCG Rate: 93  Temp: (!) 97.2 F (36.2 C) Resp: 16 BP: (!) 146/104 SpO2: 100 %  BMI: Estimated body mass index is 27.98 kg/m as calculated from the following:   Height as of this encounter: 5\' 10"  (1.778 m).   Weight as of this encounter: 195 lb (88.5 kg).  Risk Assessment: Allergies: Reviewed. He has No Known Allergies.  Allergy Precautions: None  required Coagulopathies: Reviewed. None identified.  Blood-thinner therapy: None at this time Active Infection(s): Reviewed. None identified. Erik Wallace is afebrile  Site Confirmation: Erik Wallace was asked to confirm the procedure and laterality before marking the site Procedure checklist: Completed Consent: Before the procedure and under the influence of no sedative(s), amnesic(s), or anxiolytics, the patient was informed of the treatment options, risks and possible complications. To fulfill our ethical and legal obligations, as recommended by the American Medical Association's Code of Ethics, I have informed the patient of my clinical impression; the nature and purpose of the treatment or procedure; the risks, benefits, and possible complications of the intervention; the alternatives, including doing nothing; the risk(s) and benefit(s) of the alternative treatment(s) or procedure(s); and the risk(s) and benefit(s) of doing nothing. The patient was provided information about the general risks and possible complications associated with the procedure. These may include, but are not limited to: failure to achieve desired goals, infection, bleeding, organ or nerve damage, allergic reactions, paralysis, and death. In addition, the patient was informed of those risks and complications associated to the procedure, such as failure to decrease pain; infection; bleeding; organ or nerve damage with subsequent damage to sensory, motor, and/or autonomic systems, resulting in permanent pain, numbness, and/or weakness of one or several areas of the body; allergic reactions; (i.e.: anaphylactic reaction); and/or death. Furthermore, the patient was informed of those risks and complications associated with the medications. These include, but are not limited to: allergic reactions (i.e.: anaphylactic or anaphylactoid reaction(s)); adrenal axis suppression; blood sugar elevation that in diabetics may result in ketoacidosis  or comma; water retention that in patients with history of congestive heart failure may result in shortness of breath, pulmonary edema, and decompensation with resultant heart  failure; weight gain; swelling or edema; medication-induced neural toxicity; particulate matter embolism and blood vessel occlusion with resultant organ, and/or nervous system infarction; and/or aseptic necrosis of one or more joints. Finally, the patient was informed that Medicine is not an exact science; therefore, there is also the possibility of unforeseen or unpredictable risks and/or possible complications that may result in a catastrophic outcome. The patient indicated having understood very clearly. We have given the patient no guarantees and we have made no promises. Enough time was given to the patient to ask questions, all of which were answered to the patient's satisfaction. [name] has indicated that he wanted to continue with the procedure. Attestation: I, the ordering provider, attest that I have discussed with the patient the benefits, risks, side-effects, alternatives, likelihood of achieving goals, and potential problems during recovery for the procedure that I have provided informed consent. Date  Time: 12/12/2021 10:49 AM  Pre-Procedure Preparation:  Monitoring: As per clinic protocol. Respiration, ETCO2, SpO2, BP, heart rate and rhythm monitor placed and checked for adequate function Safety Precautions: Patient was assessed for positional comfort and pressure points before starting the procedure. Time-out: I initiated and conducted the "Time-out" before starting the procedure, as per protocol. The patient was asked to participate by confirming the accuracy of the "Time Out" information. Verification of the correct person, site, and procedure were performed and confirmed by me, the nursing staff, and the patient. "Time-out" conducted as per Joint Commission's Universal Protocol (UP.01.01.01). Time:  1119  Description of Procedure:           a right piriformis trigger point injection was done 1 cm inferior, 1 cm deep, 1 cm lateral to the inferior fissure of the SI joint.  Contrast was injected to confirm piriformis muscle striation. 5 cc solution made of 4c of 0.2% ropivacaine, 1 cc of Decadron 10 mg/cc injected. While injecting, patient did not complain of any pain radiating down his leg.   Vitals:   12/12/21 1051 12/12/21 1110 12/12/21 1120  BP: (!) 146/104 (!) 198/107 (!) 178/119  Pulse: 93 92 88  Resp: 16 16 19   Temp: (!) 97.2 F (36.2 C)    TempSrc: Temporal    SpO2: 100% 97% 96%  Weight: 195 lb (88.5 kg)    Height: 5\' 10"  (1.778 m)      Start Time: 1119 hrs. End Time: 1122 hrs. Materials:  Needle(s) Type: Spinal needle(s) Gauge: 25G Length: 3.5-in    Imaging Guidance:          Type of Imaging Technique: Fluoroscopy Guidance (Non-spinal) Indication(s): N/A Exposure Time: No patient exposure Contrast: Before injecting any contrast, we confirmed that the patient did not have an allergy to iodine, shellfish, or radiological contrast. Once satisfactory needle placement was completed at the desired level, radiological contrast was injected. Contrast injected under live fluoroscopy. No contrast complications. See chart for type and volume of contrast used. Fluoroscopic Guidance: I was personally present during the use of fluoroscopy. "Tunnel Vision Technique" used to obtain the best possible view of the target area. Parallax error corrected before commencing the procedure. "Direction-depth-direction" technique used to introduce the needle under continuous pulsed fluoroscopy. Once target was reached, antero-posterior, oblique, and lateral fluoroscopic projection used confirm needle placement in all planes. Images permanently stored in EMR. Ultrasound Guidance: N/A Interpretation: N/A    Post-operative Assessment:  Post-procedure Vital Signs:  Pulse/HCG Rate: 88  Temp:   (!) 97.2 F (36.2 C) Resp: 19 BP:  (!) 178/119 SpO2: 96 %  EBL: None  Complications: No immediate post-treatment complications observed by team, or reported by patient.  Note: The patient tolerated the entire procedure well. A repeat set of vitals were taken after the procedure and the patient was kept under observation following institutional policy, for this type of procedure. Post-procedural neurological assessment was performed, showing return to baseline, prior to discharge. The patient was provided with post-procedure discharge instructions, including a section on how to identify potential problems. Should any problems arise concerning this procedure, the patient was given instructions to immediately contact us, at any time, without hesitation. In any case, we plan to contact the patient by telephone for a follow-up status report regarding this interventional procedure.  Comments:  No additional relevant information.  Plan of Care  Orders:  Orders Placed This Encounter  Procedures   DG PAIN CLINIC C-ARM 1-60 MIN NO REPORT    Intraoperative interpretation by procedural physician at [facility].    Standing Status:   Standing    Number of Occurrences:   1    Order Specific Question:   Reason for exam:    Answer:   Assistance in needle guidance and placement for procedures requiring needle placement in or near specific anatomical locations not easily accessible without such assistance.    Medications ordered for procedure: Meds ordered this encounter  Medications   iohexol (OMNIPAQUE) 180 MG/ML injection 10 mL    Must be Myelogram-compatible. If not available, you may substitute with a water-soluble, non-ionic, hypoallergenic, myelogram-compatible radiological contrast medium.   lidocaine (XYLOCAINE) 2 % (with pres) injection 400 mg   dexamethasone (DECADRON) injection 10 mg   ropivacaine (PF) 2 mg/mL (0.2%) (NAROPIN) injection 4 mL   Medications administered: We  administered iohexol, lidocaine, dexamethasone, and ropivacaine (PF) 2 mg/mL (0.2%).  See the medical record for exact dosing, route, and time of administration.  Follow-up plan:   Return in about 3 weeks (around 01/02/2022) for Post Procedure Evaluation, virtual.       Left S1 TF ESI 11/07/21, Right Piriformis TPI 12/12/21      Recent Visits Date Type Provider Dept  11/28/21 Office Visit Edward Jolly, MD [facility]  11/07/21 Office Visit Edward Jolly, MD [facility]  Showing recent visits within past 90 days and meeting all other requirements Today's Visits Date Type Provider Dept  12/12/21 Procedure visit Edward Jolly, MD [facility]  Showing today's visits and meeting all other requirements Future Appointments Date Type Provider Dept  12/31/21 Appointment Edward Jolly, MD [facility]  Showing future appointments within next 90 days and meeting all other requirements  Disposition: Discharge home  Discharge (Date  Time): 12/12/2021; 1125 hrs.   Primary Care Physician: Pcp, No Location: [facility] Note by: Edward Jolly, MD Date: 12/12/2021; Time: 11:45 AM  Disclaimer:  Medicine is not an exact science. The only guarantee in medicine is that nothing is guaranteed. It is important to note that the decision to proceed with this intervention was based on the information collected from the patient. The Data and conclusions were drawn from the patient's questionnaire, the interview, and the physical examination. Because the information was provided in large part by the patient, it cannot be guaranteed that it has not been purposely or unconsciously manipulated. Every effort has been made to obtain as much relevant data as possible for this evaluation. It is important to note that the conclusions that lead to this procedure are derived in large part from the  available data. Always take into account that the treatment  will also be dependent on availability of resources and existing treatment guidelines, considered by other Pain Management Practitioners as being common knowledge and practice, at the time of the intervention. For Medico-Legal purposes, it is also important to point out that variation in procedural techniques and pharmacological choices are the acceptable norm. The indications, contraindications, technique, and results of the above procedure should only be interpreted and judged by a Board-Certified Interventional Pain Specialist with extensive familiarity and expertise in the same exact procedure and technique.

## 2021-12-12 NOTE — Patient Instructions (Signed)

## 2021-12-13 ENCOUNTER — Telehealth: Payer: Self-pay

## 2021-12-13 ENCOUNTER — Telehealth: Payer: Self-pay | Admitting: *Deleted

## 2021-12-13 NOTE — Telephone Encounter (Signed)
No problems post procedure. 

## 2021-12-13 NOTE — Telephone Encounter (Signed)
I have typed up a new note.

## 2021-12-13 NOTE — Telephone Encounter (Signed)
-----   Message from Rockey Situ sent at 12/13/2021 11:04 AM EDT ----- Regarding: detailed work note Contact: [phone] Patient seen on 12/11/2021. W/C adjuster is requesting a letter with the exact return to work date. They can not accept "6 weeks" like the letter states. Once this is complete let me know so I can fax it in along with the last office note.

## 2021-12-14 NOTE — Telephone Encounter (Signed)
Left patient a message that the information has been faxed.

## 2021-12-17 ENCOUNTER — Encounter: Payer: Self-pay | Admitting: Neurosurgery

## 2021-12-26 ENCOUNTER — Other Ambulatory Visit: Payer: Self-pay

## 2021-12-26 DIAGNOSIS — M5416 Radiculopathy, lumbar region: Secondary | ICD-10-CM

## 2021-12-26 DIAGNOSIS — G5701 Lesion of sciatic nerve, right lower limb: Secondary | ICD-10-CM

## 2021-12-27 ENCOUNTER — Telehealth: Payer: Self-pay

## 2021-12-27 NOTE — Telephone Encounter (Signed)
I spoke with Erik Wallace. 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 He is limited to how far he can walk, but feels like sitting is no problem. He feels like he can handle 8 hour days.

## 2021-12-27 NOTE — Telephone Encounter (Signed)
-----   Message from Rockey Situ sent at 12/27/2021  1:37 PM EDT ----- Regarding: work note Contact: [phone] Dr.Yarbrough took him out of work. 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 Can you add how  many hours you feel he can do this per day. He will access the note in his mychart.

## 2021-12-28 NOTE — Telephone Encounter (Signed)
Work note written and sent to pt via mychart.

## 2021-12-31 ENCOUNTER — Encounter: Payer: Self-pay | Admitting: Student in an Organized Health Care Education/Training Program

## 2021-12-31 ENCOUNTER — Ambulatory Visit
Payer: Commercial Managed Care - PPO | Attending: Student in an Organized Health Care Education/Training Program | Admitting: Student in an Organized Health Care Education/Training Program

## 2021-12-31 DIAGNOSIS — G5701 Lesion of sciatic nerve, right lower limb: Secondary | ICD-10-CM

## 2021-12-31 NOTE — Progress Notes (Signed)
Patient: Erik Wallace  Service Category: E/M  Provider: Gillis Santa, MD  DOB: 1966-03-05  DOS: 12/31/2021  Location: Office  MRN: [id number]  Setting: Ambulatory outpatient  Referring Provider: No ref. provider found  Type: Established Patient  Specialty: Interventional Pain Management  PCP: Pcp, No  Location: Remote location  Delivery: TeleHealth     Virtual Encounter - Pain Management PROVIDER NOTE: Information contained herein reflects review and annotations entered in association with encounter. Interpretation of such information and data should be left to medically-trained personnel. Information provided to patient can be located elsewhere in the medical record under "Patient Instructions". Document created using STT-dictation technology, any transcriptional errors that may result from process are unintentional.    Contact & Pharmacy Preferred: [phone] Home: [phone] (home) Mobile: [phone] (mobile) E-mail: [email or web address]  [id number] [place], [place] - [id number]: [phone]: [phone]  Pre-screening  [name] "in-person" vs "virtual" encounter. He indicated preferring virtual for this encounter.   Reason COVID-19*  Social distancing based on CDC and AMA recommendations.   I contacted Erik Wallace 12/31/2021 via telephone.      I clearly identified myself as BGillis Santa MD. I verified that I was speaking with the correct person using two identifiers (Name: Erik Wallace and date of birth: [birth year].  Consent I sought verbal advanced consent from Erik Jackfor virtual visit interactions. I informed [name] possible security and privacy concerns, risks, and limitations associated with providing "not-in-person" medical evaluation and management services. I also informed Mr. BEickof the availability of "in-person" appointments. Finally, I informed him that there  would be a charge for the virtual visit and that he could be  personally, fully or partially, financially responsible for it. [name] understanding and agreed to proceed.   Historic Elements   Mr. MDEMARKIS GHEENis a 56y.o. year old, male patient evaluated today after our last contact on 12/12/2021. Erik Wallace has a past medical history of Back pain, Lumbar radiculopathy, and Spinal stenosis at L4-L5 level. He also  has a past surgical history that includes Hernia repair (Left); Left L5-S1 discectomy; L4-5 decompressio; and Lumbar laminectomy/decompression microdiscectomy (N/A, 07/18/2021). [name] has no medications in their medication list. He  reports that he has never smoked. He has never used smokeless tobacco. He reports current alcohol use of about 3.0 standard drinks of alcohol per week. He reports that he does not use drugs. [name] No Known Allergies.   HPI  Today, he is being contacted for a post-procedure assessment.   Post-procedure evaluation    Procedure:          Anesthesia, Analgesia, Anxiolysis:  Right PIRIFORMIS trigger point injection under fluoroscopy.     1. Piriformis syndrome of right side    NAS-11 Pain score:   Pre-procedure: 5 /10   Post-procedure:0/10      Effectiveness:  Initial hour after procedure: 10 %  Subsequent 4-6 hours post-procedure: 10 %  Analgesia past initial 6 hours: 10 % (patient reports that he felt good for the rest of the day and the next day the numbness had worn off and the pain and tightness returned.  ( first day he felt good but only rated that his pain went from a 5 to a 4 which is why I am giving 10% improvement)  Ongoing improvement:  Analgesic: back to baseline Function: No  benefit ROM: No benefit   Laboratory Chemistry Profile   Renal Lab Results  Component Value Date   BUN 16 10/27/2021   CREATININE 1.05 10/27/2021   GFRNONAA >60 10/27/2021    Hepatic No results found for: "AST", "ALT",  "ALBUMIN", "ALKPHOS", "HCVAB", "AMYLASE", "LIPASE", "AMMONIA"  Electrolytes Lab Results  Component Value Date   NA 137 10/27/2021   K 3.9 10/27/2021   CL 103 10/27/2021   CALCIUM 9.3 10/27/2021    Bone No results found for: "VD25OH", "VD125OH2TOT", "UV2536UY4", "IH4742VZ5", "25OHVITD1", "25OHVITD2", "25OHVITD3", "TESTOFREE", "TESTOSTERONE"  Inflammation (CRP: Acute Phase) (ESR: Chronic Phase) No results found for: "CRP", "ESRSEDRATE", "LATICACIDVEN"       Note: Above Lab results reviewed.  Imaging  DG PAIN CLINIC C-ARM 1-60 MIN NO REPORT Fluoro was used, but no Radiologist interpretation will be provided.  Please refer to "NOTES" tab for provider progress note.  Assessment  The encounter diagnosis was Piriformis syndrome of right side.  Plan of Care   Unfortunately, no long-term benefit with the right piriformis TPI.  He states that Dr. Cari Wallace has referred him to orthopedics for further work-up of his hip.  This is reasonable.  Follow-up as needed.   Follow-up plan:   No follow-ups on file.     Left S1 TF ESI 11/07/21, Right Piriformis TPI 12/12/21       Recent Visits Date Type Provider Dept  12/12/21 Procedure visit Gillis Santa, MD [facility]  11/28/21 Office Visit Gillis Santa, MD [facility]  11/07/21 Office Visit Gillis Santa, MD [facility]  Showing recent visits within past 90 days and meeting all other requirements Today's Visits Date Type Provider Dept  12/31/21 Office Visit Gillis Santa, MD [facility]  Showing today's visits and meeting all other requirements Future Appointments No visits were found meeting these conditions. Showing future appointments within next 90 days and meeting all other requirements  I discussed the assessment and treatment plan with the patient. The patient was provided an opportunity to ask questions and all were answered. The patient agreed with the plan and demonstrated an  understanding of the instructions.  Patient advised to call back or seek an in-person evaluation if the symptoms or condition worsens.  Duration of encounter: 3mnutes.  Note by: BGillis Santa MD Date: 12/31/2021; Time: 3:20 PM

## 2022-01-17 ENCOUNTER — Ambulatory Visit: Payer: Worker's Compensation | Admitting: Neurosurgery

## 2022-01-28 ENCOUNTER — Encounter: Payer: Self-pay | Admitting: *Deleted

## 2022-01-29 ENCOUNTER — Ambulatory Visit (INDEPENDENT_AMBULATORY_CARE_PROVIDER_SITE_OTHER): Payer: Worker's Compensation | Admitting: Neurosurgery

## 2022-01-29 ENCOUNTER — Encounter: Payer: Self-pay | Admitting: Neurosurgery

## 2022-01-29 VITALS — BP 160/113 | HR 82 | Ht 70.0 in | Wt 206.0 lb

## 2022-01-29 DIAGNOSIS — M5416 Radiculopathy, lumbar region: Secondary | ICD-10-CM

## 2022-01-29 DIAGNOSIS — M5441 Lumbago with sciatica, right side: Secondary | ICD-10-CM

## 2022-01-29 DIAGNOSIS — G894 Chronic pain syndrome: Secondary | ICD-10-CM

## 2022-01-29 DIAGNOSIS — G8929 Other chronic pain: Secondary | ICD-10-CM

## 2022-01-29 NOTE — Progress Notes (Signed)
Referring Physician:  Meade Maw, [address] [place],  [place] [address]  Primary Physician:  Pcp, No  DOS 07/18/21 (L L4-5 decompression and L L5-S1 microdiscectomy)  History of Present Illness: 01/29/2022 Erik Wallace returns to see me.  He had a piriformis injection on the right side which did not help him.  He has continued physical therapy and exercises without improvement.  His right leg pain continues to be a substantial problem.  His left leg is feeling much better.  12/11/2021 Erik Wallace continues to have R buttock pain.  His left leg continues to improve.  11/30/2021 Erik Wallace returns to see me.  His left leg symptoms from his recurrent disc herniation are improving.  He still has numbness but it is not bothering him as much.  His primary issue is right leg pain in his lateral upper right thigh.  It is very focal and does not extend down his leg.  It is greatly impacting his ability to walk and change positions.  He has been working with physical therapy and dry needling, which has helped but he remains in significant pain.  10/30/21 Erik Wallace is here today with a chief complaint of severe leg pain and left leg numbness.  He was at work last week when he reached for a night and that was falling.  He had immediate onset of left leg pain similar to what he had prior to surgery.  He has since been suffering from severe radicular pain down his left leg mostly down the back of his thigh and back of his calf.  He also has numbness in the lateral aspect of his foot.  He reports some functional weakness.  He denies bowel or bladder dysfunction.  He went to the emergency department on Saturday where an MRI scan showed a recurrent disc herniation L5-S1.   Bowel/Bladder Dysfunction: none  Conservative measures:  Physical therapy: None since surgery Multimodal medical therapy including regular antiinflammatories: Steroid taper initiated in the emergency  department Injections: Has not had epidural steroid injections  Past Surgery: Please see above  Erik Wallace has no symptoms of cervical myelopathy.  The symptoms are causing a significant impact on the patient's life.   Review of Systems:  A 10 point review of systems is negative, except for the pertinent positives and negatives detailed in the HPI.  Past Medical History: Past Medical History:  Diagnosis Date   Back pain    Lumbar radiculopathy    Spinal stenosis at L4-L5 level     Past Surgical History: Past Surgical History:  Procedure Laterality Date   HERNIA REPAIR Left    inguinal hernia   L4-5 decompressio     Left L5-S1 discectomy     LUMBAR LAMINECTOMY/DECOMPRESSION MICRODISCECTOMY N/A 07/18/2021   Procedure: L4-5 POSTERIOR SPINAL DECOMPRESSION, LEFT L5-S1  MICRODISCECTOMY;  Surgeon: Meade Maw, MD;  Location: [facility] ORS;  Service: Neurosurgery;  Laterality: N/A;    Allergies: Allergies as of 01/29/2022   (No Known Allergies)    Medications: No outpatient medications have been marked as taking for the 01/29/22 encounter (Office Visit) with Meade Maw, MD.    Social History: Social History   Tobacco Use   Smoking status: Never   Smokeless tobacco: Never  Vaping Use   Vaping Use: Never used  Substance Use Topics   Alcohol use: Yes    Alcohol/week: 3.0 standard drinks of alcohol    Types: 1 Glasses of wine, 1 Cans of  beer, 1 Shots of liquor per week    Comment: occassionally   Drug use: Never    Family Medical History: Family History  Problem Relation Age of Onset   Heart disease Mother     Physical Examination: Vitals:   01/29/22 1126  BP: (!) 160/113  Pulse: 82    General: Patient is well developed, well nourished, calm, collected, and in no apparent distress. Attention to examination is appropriate.  Psychiatric: Patient is non-anxious.  Head:  Pupils equal, round, and reactive to light.  ENT:  Oral mucosa appears  well hydrated.  Neck:   Supple.  Full range of motion.  Respiratory: Patient is breathing without any difficulty.  Extremities: No edema.  Vascular: Palpable dorsal pedal pulses.  Skin:   On exposed skin, there are no abnormal skin lesions.  NEUROLOGICAL:     Awake, alert, oriented to person, place, and time.  Speech is clear and fluent. Fund of knowledge is appropriate.   Cranial Nerves: Pupils equal round and reactive to light.  Facial tone is symmetric.  Facial sensation is symmetric. Shoulder shrug is symmetric. Tongue protrusion is midline.  There is no pronator drift.    Strength: Side Biceps Triceps Deltoid Interossei Grip Wrist Ext. Wrist Flex.  R 5 5 5 5 5 5 5   L 5 5 5 5 5 5 5    Side Iliopsoas Quads Hamstring PF DF EHL  R 5 5 5 5 5 5   L 5 5 5  4+ 5 5   Reflexes are 1+ and symmetric at the biceps, triceps, brachioradialis, patella and achilles.   Hoffman's is absent.  Clonus is not present.  Toes are down-going.  Bilateral upper and lower extremity sensation is intact to light touch.    No evidence of dysmetria noted.  Gait is antalgic.     Medical Decision Making  Imaging: MRI L spine 10/27/21 IMPRESSION: 1. L5-S1 left paracentral herniation impinging on the left S1 nerve root. 2. L4-5 degeneration and anterolisthesis. Moderate spinal stenosis that is improved after decompression.     Electronically Signed   By: M.D.   On: 10/27/2021 10:26  I have personally reviewed the images and agree with the above interpretation.  Assessment and Plan: Erik Wallace is a pleasant 56 y.o. male with recurrent disc herniation causing left-sided S1 radiculopathy.  This occurred at work and his work-related injury.  His symptoms are improving from this.   He is also having pain in his right leg of unknown etiology.  At this point, I have recommended starting back over with repeat imaging and a nerve conduction study.  I recommended thoracic and lumbar MRI  scans as well as a right hip MRI scan.   He may ultimately need spinal cord stimulation if no lesion is seen on his imaging.   He will continue to work as he has been previously without significant lifting or twisting.  We will give him a letter for with these restrictions.    I spent a total of 15 minutes in face-to-face and non-face-to-face activities related to this patient's care today.  Thank you for involving me in the care of this patient.   This note was partially dictated using voice recognition software, so please excuse any errors that were not corrected.   Erik Wallace K. Tiburcio Pea MD, [facility] Neurosurgery

## 2022-01-29 NOTE — Addendum Note (Signed)
Addended by: Berdine Addison on: 01/29/2022 12:08 PM   Modules accepted: Orders

## 2022-02-11 ENCOUNTER — Ambulatory Visit
Admission: RE | Admit: 2022-02-11 | Discharge: 2022-02-11 | Disposition: A | Discharge status: Home / Self Care | Payer: Commercial Managed Care - PPO | Source: Ambulatory Visit | Attending: Neurosurgery | Admitting: Neurosurgery

## 2022-02-11 ENCOUNTER — Ambulatory Visit
Admission: RE | Admit: 2022-02-11 | Discharge: 2022-02-11 | Disposition: A | Discharge status: Home / Self Care | Payer: Worker's Compensation | Source: Ambulatory Visit | Attending: Neurosurgery | Admitting: Neurosurgery

## 2022-02-11 DIAGNOSIS — G894 Chronic pain syndrome: Secondary | ICD-10-CM

## 2022-02-11 DIAGNOSIS — M5416 Radiculopathy, lumbar region: Secondary | ICD-10-CM

## 2022-02-11 DIAGNOSIS — G8929 Other chronic pain: Secondary | ICD-10-CM

## 2022-02-14 ENCOUNTER — Ambulatory Visit: Payer: Worker's Compensation | Admitting: Neurosurgery

## 2022-02-23 ENCOUNTER — Encounter: Payer: Self-pay | Admitting: Neurosurgery

## 2022-02-28 ENCOUNTER — Ambulatory Visit: Payer: Worker's Compensation | Admitting: Neurosurgery

## 2022-03-01 ENCOUNTER — Encounter: Payer: Self-pay | Admitting: Neurosurgery

## 2022-03-01 ENCOUNTER — Ambulatory Visit (INDEPENDENT_AMBULATORY_CARE_PROVIDER_SITE_OTHER): Payer: Worker's Compensation | Admitting: Neurosurgery

## 2022-03-01 VITALS — BP 155/99 | HR 92 | Wt 206.0 lb

## 2022-03-01 DIAGNOSIS — M5441 Lumbago with sciatica, right side: Secondary | ICD-10-CM

## 2022-03-01 DIAGNOSIS — M5442 Lumbago with sciatica, left side: Secondary | ICD-10-CM | POA: Diagnosis not present

## 2022-03-01 DIAGNOSIS — G8929 Other chronic pain: Secondary | ICD-10-CM | POA: Diagnosis not present

## 2022-03-01 MED ORDER — MELOXICAM 7.5 MG PO TABS: 7.5000 mg | ORAL_TABLET | Freq: Every day | ORAL | 0 refills | Status: DC

## 2022-03-01 MED ORDER — TIZANIDINE HCL 4 MG PO TABS: 4.0000 mg | ORAL_TABLET | Freq: Three times a day (TID) | ORAL | 1 refills | Status: DC | PRN

## 2022-03-01 MED ORDER — OXYCODONE HCL 5 MG PO TABS: 5.0000 mg | ORAL_TABLET | Freq: Four times a day (QID) | ORAL | 0 refills | Status: DC | PRN

## 2022-03-01 NOTE — Progress Notes (Signed)
Referring Physician:  No referring provider defined for this encounter.  Primary Physician:  Pcp, No  DOS 07/18/21 (L L4-5 decompression and L L5-S1 microdiscectomy)  History of Present Illness: 03/01/2022 Erik Wallace continues to have severe right leg pain.  He is also having left buttock pain as well as back pain.  01/29/2022 Erik Wallace returns to see me.  He had a piriformis injection on the right side which did not help him.  He has continued physical therapy and exercises without improvement.  His right leg pain continues to be a substantial problem.  His left leg is feeling much better.  12/11/2021 Erik Wallace continues to have R buttock pain.  His left leg continues to improve.  11/30/2021 Erik Wallace returns to see me.  His left leg symptoms from his recurrent disc herniation are improving.  He still has numbness but it is not bothering him as much.  His primary issue is right leg pain in his lateral upper right thigh.  It is very focal and does not extend down his leg.  It is greatly impacting his ability to walk and change positions.  He has been working with physical therapy and dry needling, which has helped but he remains in significant pain.  10/30/21 Erik Wallace is here today with a chief complaint of severe leg pain and left leg numbness.  He was at work last week when he reached for a night and that was falling.  He had immediate onset of left leg pain similar to what he had prior to surgery.  He has since been suffering from severe radicular pain down his left leg mostly down the back of his thigh and back of his calf.  He also has numbness in the lateral aspect of his foot.  He reports some functional weakness.  He denies bowel or bladder dysfunction.  He went to the emergency department on Saturday where an MRI scan showed a recurrent disc herniation L5-S1.   Bowel/Bladder Dysfunction: none  Conservative measures:  Physical therapy: None since  surgery Multimodal medical therapy including regular antiinflammatories: Steroid taper initiated in the emergency department Injections: Has not had epidural steroid injections  Past Surgery: Please see above  Erik Wallace has no symptoms of cervical myelopathy.  The symptoms are causing a significant impact on the patient's life.   Review of Systems:  A 10 point review of systems is negative, except for the pertinent positives and negatives detailed in the HPI.  Past Medical History: Past Medical History:  Diagnosis Date   Back pain    Lumbar radiculopathy    Spinal stenosis at L4-L5 level     Past Surgical History: Past Surgical History:  Procedure Laterality Date   HERNIA REPAIR Left    inguinal hernia   L4-5 decompressio     Left L5-S1 discectomy     LUMBAR LAMINECTOMY/DECOMPRESSION MICRODISCECTOMY N/A 07/18/2021   Procedure: L4-5 POSTERIOR SPINAL DECOMPRESSION, LEFT L5-S1  MICRODISCECTOMY;  Surgeon: Venetia Night, MD;  Location: [facility] ORS;  Service: Neurosurgery;  Laterality: N/A;    Allergies: Allergies as of 03/01/2022   (No Known Allergies)    Medications: No outpatient medications have been marked as taking for the 03/01/22 encounter (Office Visit) with Venetia Night, MD.    Social History: Social History   Tobacco Use   Smoking status: Never   Smokeless tobacco: Never  Vaping Use   Vaping Use: Never used  Substance Use Topics   Alcohol use: Yes  Alcohol/week: 3.0 standard drinks of alcohol    Types: 1 Glasses of wine, 1 Cans of beer, 1 Shots of liquor per week    Comment: occassionally   Drug use: Never    Family Medical History: Family History  Problem Relation Age of Onset   Heart disease Mother     Physical Examination: Vitals:   03/01/22 1022  BP: (!) 155/99  Pulse: 92    General: Patient is well developed, well nourished, calm, collected, and in no apparent distress. Attention to examination is  appropriate.  Psychiatric: Patient is non-anxious.  Head:  Pupils equal, round, and reactive to light.  ENT:  Oral mucosa appears well hydrated.  Neck:   Supple.  Full range of motion.  Respiratory: Patient is breathing without any difficulty.  Extremities: No edema.  Vascular: Palpable dorsal pedal pulses.  Skin:   On exposed skin, there are no abnormal skin lesions.  NEUROLOGICAL:     Awake, alert, oriented to person, place, and time.  Speech is clear and fluent. Fund of knowledge is appropriate.   Cranial Nerves: Pupils equal round and reactive to light.  Facial tone is symmetric.  Facial sensation is symmetric. Shoulder shrug is symmetric. Tongue protrusion is midline.  There is no pronator drift.    Strength: Side Biceps Triceps Deltoid Interossei Grip Wrist Ext. Wrist Flex.  R 5 5 5 5 5 5 5  L 5 5 5 5 5 5 5   Side Iliopsoas Quads Hamstring PF DF EHL  R 5 5 5 5 5 5  L 5 5 5 4+ 5 5   Reflexes are 1+ and symmetric at the biceps, triceps, brachioradialis, patella and achilles.   Hoffman's is absent.  Clonus is not present.  Toes are down-going.  Bilateral upper and lower extremity sensation is intact to light touch.    No evidence of dysmetria noted.  Gait is antalgic.     Medical Decision Making  Imaging: MRI L spine 10/27/21 IMPRESSION: 1. L5-S1 left paracentral herniation impinging on the left S1 nerve root. 2. L4-5 degeneration and anterolisthesis. Moderate spinal stenosis that is improved after decompression.     Electronically Signed   By: Jonathan  Watts M.D.   On: 10/27/2021 10:26   MRI L spine 02/11/2022 MR LUMBAR SPINE IMPRESSION   1. L4-5 high-grade degenerative spinal stenosis has mildly progressed from 10/27/2021. Either L5 nerve root could be affected in the subarticular recesses. Moderate bilateral foraminal narrowing. 2. L5-S1 unchanged left paracentral herniation at site of prior decompression. Left S1 impingement.      Electronically Signed   By: Jonathan  Watts M.D.   On: 02/12/2022 20:06  MRI R hip 02/11/2022 IMPRESSION: 1. Severe muscle edema in the left piriformis muscle concerning for muscle strain with a small partial-thickness tear. 2. Mild muscle edema to a lesser extent involving the left gluteus maximus muscle concerning for muscle strain. 3. Mild osteoarthritis of bilateral hips. 4. Degeneration of the right labrum with a right anterior labral tear. Left superior paralabral cyst measuring 9 mm likely reflecting underlying labral tear.     Electronically Signed   By: Hetal  Patel M.D.   On: 02/13/2022 09:57  L spine xray 02/11/2022  IMPRESSION: 1. Mild multilevel lumbar spine DDD, worse at L4-L5. 2. Mild scoliotic curvature of the thoracolumbar spine with caudal component convex to the left measuring approximately 8 degrees, potentially positional.     Electronically Signed   By: John  Watts M.D.   On: 02/12/2022   09:57     I have personally reviewed the images and agree with the above interpretation.  EMG 02/21/22 Normal  Assessment and Plan: Mr. Kalina is a pleasant 56 y.o. male with recurrent disc herniation causing left-sided S1 radiculopathy.  He has also had worsening of his degenerative changes at L4-5 and now has bilateral L5 nerve root compression in the lateral recesses.  He has tried physical therapy and injections without improvement.  He continues to have difficulty with pain control.  Narcotics and muscle relaxants have not been helpful.  Injections have not helped.  At this point, I do not have any conservative options left for him.  I have recommended L4-S1 transforaminal lumbar interbody fusion to decompress his nerve roots and address his recurrent disc herniation.  I will also touch base with one of the other physicians who specializes in joints to have them look at his hip MRI scan, as I am unaware of the significance of the findings.  I discussed the  planned procedure at length with the patient, including the risks, benefits, alternatives, and indications. The risks discussed include but are not limited to bleeding, infection, need for reoperation, spinal fluid leak, stroke, vision loss, anesthetic complication, coma, paralysis, and even death. I also described in detail that improvement was not guaranteed.  The patient expressed understanding of these risks, and asked that we proceed with surgery. I described the surgery in layman's terms, and gave ample opportunity for questions, which were answered to the best of my ability.   I spent a total of 15 minutes in face-to-face and non-face-to-face activities related to this patient's care today.  Thank you for involving me in the care of this patient.   This note was partially dictated using voice recognition software, so please excuse any errors that were not corrected.   Erik Wallace K. Davon Abdelaziz MD, MPHS Neurosurgery 

## 2022-03-01 NOTE — Patient Instructions (Signed)
Please see below for information in regards to your upcoming surgery:  Planned surgery: L4-S1 transforaminal lumbar interbody fusion   Surgery date: 03/27/22 - you will find out your arrival time the business day before your surgery.   Pre-op appointment at [facility] Pre-admit Testing: we will call you with a date/time for this. Pre-admit testing is located on the first floor of the [facility], [address] [facility] [address]. Please bring all prescriptions in the original prescription bottles to your appointment, even if you have reviewed medications by phone with a pharmacy representative. Pre-op labs may be done at your pre-op appointment. You are not required to fast for these labs. Should you need to change your pre-op appointment, please call Pre-admit testing at [phone].    NSAIDS (Non-steroidal anti-inflammatory drugs): because you are having a fusion, no NSAIDS (such as ibuprofen, aleve, naproxen, meloxicam, diclofenac) for 3 months after surgery. Celebrex is an exception. Tylenol is ok because it is not an NSAID.   Because you are having a fusion: for appointments after your 2 week follow-up: please arrive at the [facility] outpatient imaging center ([address], [place]) or [facility] one hour prior to your appointment for x-rays. This applies to every appointment after your 2 week follow-up. Failure to do so may result in your appointment being rescheduled.   If you have FMLA/disability paperwork, please drop it off or fax it to [phone], attention [place].   If you have any questions/concerns before or after surgery, you can reach [place] at [phone], or you can send a mychart message. If you have a concern after hours that cannot wait until normal business hours, you can call [phone] and ask to page the neurosurgeon on call for [place].     Appointments/FMLA & disability paperwork: [place] Nurse:  Royston Cowper  Medical assistant: Irving Burton Physician Assistant's: Manning Charity & Drake Leach Surgeon: Venetia Night, MD

## 2022-03-01 NOTE — H&P (View-Only) (Signed)
Referring Physician:  No referring provider defined for this encounter.  Primary Physician:  Pcp, No  DOS 07/18/21 (L L4-5 decompression and L L5-S1 microdiscectomy)  History of Present Illness: 03/01/2022 Erik Wallace continues to have severe right leg pain.  He is also having left buttock pain as well as back pain.  01/29/2022 Erik Wallace returns to see me.  He had a piriformis injection on the right side which did not help him.  He has continued physical therapy and exercises without improvement.  His right leg pain continues to be a substantial problem.  His left leg is feeling much better.  12/11/2021 Erik Wallace continues to have R buttock pain.  His left leg continues to improve.  11/30/2021 Erik Wallace returns to see me.  His left leg symptoms from his recurrent disc herniation are improving.  He still has numbness but it is not bothering him as much.  His primary issue is right leg pain in his lateral upper right thigh.  It is very focal and does not extend down his leg.  It is greatly impacting his ability to walk and change positions.  He has been working with physical therapy and dry needling, which has helped but he remains in significant pain.  10/30/21 Erik Wallace is here today with a chief complaint of severe leg pain and left leg numbness.  He was at work last week when he reached for a night and that was falling.  He had immediate onset of left leg pain similar to what he had prior to surgery.  He has since been suffering from severe radicular pain down his left leg mostly down the back of his thigh and back of his calf.  He also has numbness in the lateral aspect of his foot.  He reports some functional weakness.  He denies bowel or bladder dysfunction.  He went to the emergency department on Saturday where an MRI scan showed a recurrent disc herniation L5-S1.   Bowel/Bladder Dysfunction: none  Conservative measures:  Physical therapy: None since  surgery Multimodal medical therapy including regular antiinflammatories: Steroid taper initiated in the emergency department Injections: Has not had epidural steroid injections  Past Surgery: Please see above  Erik Wallace has no symptoms of cervical myelopathy.  The symptoms are causing a significant impact on the patient's life.   Review of Systems:  A 10 point review of systems is negative, except for the pertinent positives and negatives detailed in the HPI.  Past Medical History: Past Medical History:  Diagnosis Date   Back pain    Lumbar radiculopathy    Spinal stenosis at L4-L5 level     Past Surgical History: Past Surgical History:  Procedure Laterality Date   HERNIA REPAIR Left    inguinal hernia   L4-5 decompressio     Left L5-S1 discectomy     LUMBAR LAMINECTOMY/DECOMPRESSION MICRODISCECTOMY N/A 07/18/2021   Procedure: L4-5 POSTERIOR SPINAL DECOMPRESSION, LEFT L5-S1  MICRODISCECTOMY;  Surgeon: Venetia Night, MD;  Location: [facility] ORS;  Service: Neurosurgery;  Laterality: N/A;    Allergies: Allergies as of 03/01/2022   (No Known Allergies)    Medications: No outpatient medications have been marked as taking for the 03/01/22 encounter (Office Visit) with Venetia Night, MD.    Social History: Social History   Tobacco Use   Smoking status: Never   Smokeless tobacco: Never  Vaping Use   Vaping Use: Never used  Substance Use Topics   Alcohol use: Yes  Alcohol/week: 3.0 standard drinks of alcohol    Types: 1 Glasses of wine, 1 Cans of beer, 1 Shots of liquor per week    Comment: occassionally   Drug use: Never    Family Medical History: Family History  Problem Relation Age of Onset   Heart disease Mother     Physical Examination: Vitals:   03/01/22 1022  BP: (!) 155/99  Pulse: 92    General: Patient is well developed, well nourished, calm, collected, and in no apparent distress. Attention to examination is  appropriate.  Psychiatric: Patient is non-anxious.  Head:  Pupils equal, round, and reactive to light.  ENT:  Oral mucosa appears well hydrated.  Neck:   Supple.  Full range of motion.  Respiratory: Patient is breathing without any difficulty.  Extremities: No edema.  Vascular: Palpable dorsal pedal pulses.  Skin:   On exposed skin, there are no abnormal skin lesions.  NEUROLOGICAL:     Awake, alert, oriented to person, place, and time.  Speech is clear and fluent. Fund of knowledge is appropriate.   Cranial Nerves: Pupils equal round and reactive to light.  Facial tone is symmetric.  Facial sensation is symmetric. Shoulder shrug is symmetric. Tongue protrusion is midline.  There is no pronator drift.    Strength: Side Biceps Triceps Deltoid Interossei Grip Wrist Ext. Wrist Flex.  R 5 5 5 5 5 5 5   L 5 5 5 5 5 5 5    Side Iliopsoas Quads Hamstring PF DF EHL  R 5 5 5 5 5 5   L 5 5 5  4+ 5 5   Reflexes are 1+ and symmetric at the biceps, triceps, brachioradialis, patella and achilles.   Hoffman's is absent.  Clonus is not present.  Toes are down-going.  Bilateral upper and lower extremity sensation is intact to light touch.    No evidence of dysmetria noted.  Gait is antalgic.     Medical Decision Making  Imaging: MRI L spine 10/27/21 IMPRESSION: 1. L5-S1 left paracentral herniation impinging on the left S1 nerve root. 2. L4-5 degeneration and anterolisthesis. Moderate spinal stenosis that is improved after decompression.     Electronically Signed   By: Jorje Guild M.D.   On: 10/27/2021 10:26   MRI L spine 02/11/2022 MR LUMBAR SPINE IMPRESSION   1. L4-5 high-grade degenerative spinal stenosis has mildly progressed from 10/27/2021. Either L5 nerve root could be affected in the subarticular recesses. Moderate bilateral foraminal narrowing. 2. L5-S1 unchanged left paracentral herniation at site of prior decompression. Left S1 impingement.      Electronically Signed   By: Jorje Guild M.D.   On: 02/12/2022 20:06  MRI R hip 02/11/2022 IMPRESSION: 1. Severe muscle edema in the left piriformis muscle concerning for muscle strain with a small partial-thickness tear. 2. Mild muscle edema to a lesser extent involving the left gluteus maximus muscle concerning for muscle strain. 3. Mild osteoarthritis of bilateral hips. 4. Degeneration of the right labrum with a right anterior labral tear. Left superior paralabral cyst measuring 9 mm likely reflecting underlying labral tear.     Electronically Signed   By: Kathreen Devoid M.D.   On: 02/13/2022 09:57  L spine xray 02/11/2022  IMPRESSION: 1. Mild multilevel lumbar spine DDD, worse at L4-L5. 2. Mild scoliotic curvature of the thoracolumbar spine with caudal component convex to the left measuring approximately 8 degrees, potentially positional.     Electronically Signed   By: Sandi Mariscal M.D.   On: 02/12/2022  09:57     I have personally reviewed the images and agree with the above interpretation.  EMG 02/21/22 Normal  Assessment and Plan: Erik Wallace is a pleasant 56 y.o. male with recurrent disc herniation causing left-sided S1 radiculopathy.  He has also had worsening of his degenerative changes at L4-5 and now has bilateral L5 nerve root compression in the lateral recesses.  He has tried physical therapy and injections without improvement.  He continues to have difficulty with pain control.  Narcotics and muscle relaxants have not been helpful.  Injections have not helped.  At this point, I do not have any conservative options left for him.  I have recommended L4-S1 transforaminal lumbar interbody fusion to decompress his nerve roots and address his recurrent disc herniation.  I will also touch base with one of the other physicians who specializes in joints to have them look at his hip MRI scan, as I am unaware of the significance of the findings.  I discussed the  planned procedure at length with the patient, including the risks, benefits, alternatives, and indications. The risks discussed include but are not limited to bleeding, infection, need for reoperation, spinal fluid leak, stroke, vision loss, anesthetic complication, coma, paralysis, and even death. I also described in detail that improvement was not guaranteed.  The patient expressed understanding of these risks, and asked that we proceed with surgery. I described the surgery in layman's terms, and gave ample opportunity for questions, which were answered to the best of my ability.   I spent a total of 15 minutes in face-to-face and non-face-to-face activities related to this patient's care today.  Thank you for involving me in the care of this patient.   This note was partially dictated using voice recognition software, so please excuse any errors that were not corrected.   Erik Lucy K. Izora Ribas MD, [facility]

## 2022-03-04 ENCOUNTER — Other Ambulatory Visit: Payer: Self-pay

## 2022-03-04 DIAGNOSIS — Z01818 Encounter for other preprocedural examination: Secondary | ICD-10-CM

## 2022-03-07 ENCOUNTER — Ambulatory Visit: Payer: Worker's Compensation | Admitting: Neurosurgery

## 2022-03-08 ENCOUNTER — Other Ambulatory Visit: Payer: Self-pay | Admitting: Neurosurgery

## 2022-03-08 ENCOUNTER — Telehealth: Payer: Self-pay

## 2022-03-08 NOTE — Telephone Encounter (Signed)
Noted  

## 2022-03-08 NOTE — Telephone Encounter (Signed)
-----   Message from Rockey Situ sent at 03/08/2022 11:21 AM EST ----- Regarding: RE: w/c sx approval Contact: [phone] No, I asked her for an auth number or referral number and she said there was not one. She said if you had questions you can call her. ----- Message ----- From: Sharlot Gowda, RN Sent: 03/08/2022  10:53 AM EST To: Rockey Situ Subject: RE: w/c sx approval                            No auth  # or date range? Or do I need to call her back for that info? ----- Message ----- From: Rockey Situ Sent: 03/08/2022  10:35 AM EST To: Sharlot Gowda, RN Subject: w/c sx approval                                Noralee Chars with [facility] [employer] is calling to inform you that his surgery has been approved.

## 2022-03-12 ENCOUNTER — Other Ambulatory Visit: Payer: Self-pay

## 2022-03-12 MED ORDER — OXYCODONE HCL 5 MG PO TABS: 5.0000 mg | ORAL_TABLET | Freq: Four times a day (QID) | ORAL | 0 refills | Status: DC | PRN

## 2022-03-12 MED ORDER — MELOXICAM 7.5 MG PO TABS: 7.5000 mg | ORAL_TABLET | Freq: Every day | ORAL | 0 refills | Status: DC

## 2022-03-12 NOTE — Telephone Encounter (Signed)
-----   Message from Rockey Situ sent at 03/12/2022  2:20 PM EST ----- Regarding: med refill Contact: [phone] L4-S1 TLIF on 03/27/22 Meloxicam  Oxycodone [facility] [place]

## 2022-03-12 NOTE — Telephone Encounter (Signed)
Patient confirmed refill

## 2022-03-13 NOTE — Telephone Encounter (Signed)
Pt left voicemail that he was only able to pick up the oxycodone. [facility] called [facility] and the pharmacist said they have both prescriptions, but it is too early for him to fill the others. I notified Mr Erik Wallace and reminded him that he cannot take meloxicam for 3 months once he has surgery.

## 2022-03-18 ENCOUNTER — Encounter
Admission: RE | Admit: 2022-03-18 | Discharge: 2022-03-18 | Disposition: A | Discharge status: Home / Self Care | Payer: Worker's Compensation | Source: Ambulatory Visit | Attending: Neurosurgery | Admitting: Neurosurgery

## 2022-03-18 VITALS — BP 162/92 | HR 85 | Ht 70.0 in | Wt 210.5 lb

## 2022-03-18 DIAGNOSIS — Z01812 Encounter for preprocedural laboratory examination: Secondary | ICD-10-CM

## 2022-03-18 DIAGNOSIS — I160 Hypertensive urgency: Secondary | ICD-10-CM | POA: Diagnosis not present

## 2022-03-18 HISTORY — DX: Elevated blood-pressure reading, without diagnosis of hypertension: R03.0

## 2022-03-18 LAB — BASIC METABOLIC PANEL
Anion gap: 7 (ref 5–15)
BUN: 20 mg/dL (ref 6–20)
CO2: 27 mmol/L (ref 22–32)
Calcium: 9.4 mg/dL (ref 8.9–10.3)
Chloride: 105 mmol/L (ref 98–111)
Creatinine, Ser: 0.86 mg/dL (ref 0.61–1.24)
GFR, Estimated: >60 mL/min (ref >60)
Glucose, Bld: 132 mg/dL — ABNORMAL HIGH (ref 70–99)
Potassium: 3.7 mmol/L (ref 3.5–5.1)
Sodium: 139 mmol/L (ref 135–145)

## 2022-03-18 LAB — CBC
HCT: 46.4 % (ref 39.0–52.0)
Hemoglobin: 16.2 g/dL (ref 13.0–17.0)
MCH: 31.6 pg (ref 26.0–34.0)
MCHC: 34.9 g/dL (ref 30.0–36.0)
MCV: 90.6 fL (ref 80.0–100.0)
Platelets: 252 10*3/uL (ref 150–400)
RBC: 5.12 MIL/uL (ref 4.22–5.81)
RDW: 12.3 % (ref 11.5–15.5)
WBC: 6.6 10*3/uL (ref 4.0–10.5)
nRBC: 0 % (ref 0.0–0.2)

## 2022-03-18 LAB — URINALYSIS, ROUTINE W REFLEX MICROSCOPIC
Bilirubin Urine: NEGATIVE
Glucose, UA: NEGATIVE mg/dL
Hgb urine dipstick: NEGATIVE
Ketones, ur: NEGATIVE mg/dL
Leukocytes,Ua: NEGATIVE
Nitrite: NEGATIVE
Protein, ur: NEGATIVE mg/dL
Specific Gravity, Urine: 1.017 (ref 1.005–1.030)
pH: 5 (ref 5.0–8.0)

## 2022-03-18 LAB — TYPE AND SCREEN
ABO/RH(D): A POS
Antibody Screen: NEGATIVE

## 2022-03-18 LAB — SURGICAL PCR SCREEN
MRSA, PCR: NEGATIVE
Staphylococcus aureus: NEGATIVE

## 2022-03-18 NOTE — Patient Instructions (Addendum)
Your procedure is scheduled on:03-27-22 Wednesday Report to the Registration Desk on the 1st floor of the [facility].Then proceed to the 2nd floor Surgery Desk To find out your arrival time, please call [phone] between 1PM - 3PM on:03-26-22 Tuesday If your arrival time is 6:00 am, do not arrive prior to that time as the [facility] entrance doors do not open until 6:00 am.  REMEMBER: Instructions that are not followed completely may result in serious medical risk, up to and including death; or upon the discretion of your surgeon and anesthesiologist your surgery may need to be rescheduled.  Do not eat food after midnight the night before surgery.  No gum chewing, lozengers or hard candies.  You may however, drink CLEAR liquids up to 2 hours before you are scheduled to arrive for your surgery. Do not drink anything within 2 hours of your scheduled arrival time.  Clear liquids include: - water  - apple juice without pulp - gatorade (not RED colors) - black coffee or tea (Do NOT add milk or creamers to the coffee or tea) Do NOT drink anything that is not on this list.  Do NOT take any medication the day of surgery  One week prior to surgery: Stop Anti-inflammatories (NSAIDS) such as Advil, Aleve, Ibuprofen, Motrin, Naproxen, Naprosyn and Aspirin based products such as Excedrin, Goodys Powder, BC Powder.You may however, continue to take Tylenol/Oxycodone if needed for pain up until the day of surgery.  Stop ANY OVER THE COUNTER supplements/vitamins 7 days prior to surgery (ok to continue Melatonin up until the night prior to surgery)  No Alcohol for 24 hours before or after surgery.  No Smoking including e-cigarettes for 24 hours prior to surgery.  No chewable tobacco products for at least 6 hours prior to surgery.  No nicotine patches on the day of surgery.  Do not use any "recreational" drugs for at least a week prior to your surgery.  Please be advised that the combination of  cocaine and anesthesia may have negative outcomes, up to and including death. If you test positive for cocaine, your surgery will be cancelled.  On the morning of surgery brush your teeth with toothpaste and water, you may rinse your mouth with mouthwash if you wish. Do not swallow any toothpaste or mouthwash.  Use CHG Soap as directed on instruction sheet.  Do not wear jewelry, make-up, hairpins, clips or nail polish.  Do not wear lotions, powders, or perfumes.   Do not shave body from the neck down 48 hours prior to surgery just in case you cut yourself which could leave a site for infection.  Also, freshly shaved skin may become irritated if using the CHG soap.  Contact lenses, hearing aids and dentures may not be worn into surgery.  Do not bring valuables to the hospital. [facility] is not responsible for any missing/lost belongings or valuables.   Notify your doctor if there is any change in your medical condition (cold, fever, infection).  Wear comfortable clothing (specific to your surgery type) to the hospital.  After surgery, you can help prevent lung complications by doing breathing exercises.  Take deep breaths and cough every 1-2 hours. Your doctor may order a device called an Incentive Spirometer to help you take deep breaths. When coughing or sneezing, hold a pillow firmly against your incision with both hands. This is called "splinting." Doing this helps protect your incision. It also decreases belly discomfort.  If you are being admitted to the  hospital overnight, leave your suitcase in the car. After surgery it may be brought to your room.  If you are being discharged the day of surgery, you will not be allowed to drive home. You will need a responsible adult (18 years or older) to drive you home and stay with you that night.   If you are taking public transportation, you will need to have a responsible adult (18 years or older) with you. Please confirm with your  physician that it is acceptable to use public transportation.   Please call the Pre-admissions Testing Dept. at [phone] if you have any questions about these instructions.  Surgery Visitation Policy:  Patients undergoing a surgery or procedure may have two family members or support persons with them as long as the person is not COVID-19 positive or experiencing its symptoms.   Inpatient Visitation:    Visiting hours are 7 a.m. to 8 p.m. Up to four visitors are allowed at one time in a patient room. The visitors may rotate out with other people during the day. One designated support person (adult) may remain overnight.  MASKING: Due to an increase in RSV rates and hospitalizations, starting Wednesday, Nov. 15, in patient care areas in which we serve newborns, infants and children, masks will be required for teammates and visitors.  Children ages 58 and under may not visit. This policy affects the following departments only:  [facility] Postpartum area Mother Baby Unit Newborn nursery/Special care nursery  Other areas: Masks continue to be strongly recommended for patient-facing teammates, visitors and patients in all other areas. Visitation is not restricted outside of the units listed above.

## 2022-03-26 MED ORDER — ORAL CARE MOUTH RINSE: 15.0000 mL | Freq: Once | OROMUCOSAL | Status: AC

## 2022-03-26 MED ORDER — VANCOMYCIN HCL IN DEXTROSE 1-5 GM/200ML-% IV SOLN: 1000.0000 mg | Freq: Once | INTRAVENOUS | Status: AC

## 2022-03-26 MED ORDER — CEFAZOLIN IN SODIUM CHLORIDE 2-0.9 GM/100ML-% IV SOLN
2.0000 g | Freq: Once | INTRAVENOUS | Status: DC
  Filled 2022-03-26: qty 100

## 2022-03-26 MED ORDER — CHLORHEXIDINE GLUCONATE 0.12 % MT SOLN: 15.0000 mL | Freq: Once | OROMUCOSAL | Status: AC

## 2022-03-26 MED ORDER — LACTATED RINGERS IV SOLN: INTRAVENOUS | Status: DC

## 2022-03-26 MED ORDER — FAMOTIDINE 20 MG PO TABS: 20.0000 mg | ORAL_TABLET | Freq: Once | ORAL | Status: AC

## 2022-03-26 MED ORDER — CEFAZOLIN SODIUM-DEXTROSE 2-4 GM/100ML-% IV SOLN
2.0000 g | INTRAVENOUS | Status: AC
  Administered 2022-03-27: 2 g via INTRAVENOUS

## 2022-03-27 ENCOUNTER — Other Ambulatory Visit: Payer: Self-pay

## 2022-03-27 ENCOUNTER — Inpatient Hospital Stay: Payer: Worker's Compensation | Admitting: Registered Nurse

## 2022-03-27 ENCOUNTER — Encounter
Admission: RE | Disposition: A | Discharge status: Home / Self Care | Payer: Self-pay | Source: Home / Self Care | Attending: Neurosurgery

## 2022-03-27 ENCOUNTER — Inpatient Hospital Stay: Payer: Commercial Managed Care - PPO

## 2022-03-27 ENCOUNTER — Inpatient Hospital Stay
Admission: RE | Admit: 2022-03-27 | Discharge: 2022-03-30 | DRG: 455 | Disposition: A | Discharge status: Home / Self Care | Payer: Worker's Compensation | Attending: Neurosurgery | Admitting: Neurosurgery

## 2022-03-27 ENCOUNTER — Encounter: Payer: Self-pay | Admitting: Neurosurgery

## 2022-03-27 ENCOUNTER — Inpatient Hospital Stay: Payer: Worker's Compensation | Admitting: Urgent Care

## 2022-03-27 DIAGNOSIS — Z01818 Encounter for other preprocedural examination: Secondary | ICD-10-CM

## 2022-03-27 DIAGNOSIS — M5136 Other intervertebral disc degeneration, lumbar region: Secondary | ICD-10-CM | POA: Diagnosis present

## 2022-03-27 DIAGNOSIS — M4310 Spondylolisthesis, site unspecified: Secondary | ICD-10-CM | POA: Diagnosis present

## 2022-03-27 DIAGNOSIS — Z981 Arthrodesis status: Secondary | ICD-10-CM | POA: Diagnosis present

## 2022-03-27 DIAGNOSIS — M48061 Spinal stenosis, lumbar region without neurogenic claudication: Principal | ICD-10-CM | POA: Diagnosis present

## 2022-03-27 DIAGNOSIS — M5442 Lumbago with sciatica, left side: Secondary | ICD-10-CM | POA: Diagnosis present

## 2022-03-27 DIAGNOSIS — G8929 Other chronic pain: Secondary | ICD-10-CM

## 2022-03-27 DIAGNOSIS — M5441 Lumbago with sciatica, right side: Secondary | ICD-10-CM | POA: Diagnosis present

## 2022-03-27 DIAGNOSIS — M5418 Radiculopathy, sacral and sacrococcygeal region: Secondary | ICD-10-CM | POA: Diagnosis present

## 2022-03-27 DIAGNOSIS — M5127 Other intervertebral disc displacement, lumbosacral region: Secondary | ICD-10-CM | POA: Diagnosis present

## 2022-03-27 DIAGNOSIS — M5416 Radiculopathy, lumbar region: Secondary | ICD-10-CM | POA: Diagnosis present

## 2022-03-27 HISTORY — PX: APPLICATION OF INTRAOPERATIVE CT SCAN: SHX6668

## 2022-03-27 HISTORY — PX: TRANSFORAMINAL LUMBAR INTERBODY FUSION W/ MIS 2 LEVEL: SHX6146

## 2022-03-27 SURGERY — MINIMALLY INVASIVE (MIS) TRANSFORAMINAL LUMBAR INTERBODY FUSION (TLIF) 2 LEVEL
Anesthesia: General | Site: Spine Lumbar

## 2022-03-27 MED ORDER — PHENYLEPHRINE HCL (PRESSORS) 10 MG/ML IV SOLN
INTRAVENOUS | Status: DC | PRN
  Administered 2022-03-27 (×2): 20 ug via INTRAVENOUS

## 2022-03-27 MED ORDER — IRRISEPT - 450ML BOTTLE WITH 0.05% CHG IN STERILE WATER, USP 99.95% OPTIME
TOPICAL | Status: DC | PRN
  Administered 2022-03-27: 450 mL

## 2022-03-27 MED ORDER — PHENYLEPHRINE HCL (PRESSORS) 10 MG/ML IV SOLN
INTRAVENOUS | Status: AC
  Filled 2022-03-27: qty 1

## 2022-03-27 MED ORDER — ONDANSETRON HCL 4 MG PO TABS: 4.0000 mg | ORAL_TABLET | Freq: Four times a day (QID) | ORAL | Status: DC | PRN

## 2022-03-27 MED ORDER — BISACODYL 10 MG RE SUPP: 10.0000 mg | Freq: Every day | RECTAL | Status: DC | PRN

## 2022-03-27 MED ORDER — MAGNESIUM CITRATE PO SOLN: 1.0000 | Freq: Once | ORAL | Status: DC | PRN

## 2022-03-27 MED ORDER — SODIUM CHLORIDE 0.9% FLUSH
3.0000 mL | Freq: Two times a day (BID) | INTRAVENOUS | Status: DC
  Administered 2022-03-27 – 2022-03-30 (×6): 3 mL via INTRAVENOUS

## 2022-03-27 MED ORDER — BUPIVACAINE-EPINEPHRINE (PF) 0.5% -1:200000 IJ SOLN
INTRAMUSCULAR | Status: AC
  Filled 2022-03-27: qty 30

## 2022-03-27 MED ORDER — SUCCINYLCHOLINE CHLORIDE 200 MG/10ML IV SOSY
PREFILLED_SYRINGE | INTRAVENOUS | Status: DC | PRN
  Administered 2022-03-27: 100 mg via INTRAVENOUS

## 2022-03-27 MED ORDER — FAMOTIDINE 20 MG PO TABS
ORAL_TABLET | ORAL | Status: AC
  Administered 2022-03-27: 20 mg via ORAL
  Filled 2022-03-27: qty 1

## 2022-03-27 MED ORDER — FENTANYL CITRATE (PF) 100 MCG/2ML IJ SOLN
25.0000 ug | INTRAMUSCULAR | Status: DC | PRN
  Administered 2022-03-27: 50 ug via INTRAVENOUS
  Administered 2022-03-27: 25 ug via INTRAVENOUS

## 2022-03-27 MED ORDER — FENTANYL CITRATE (PF) 100 MCG/2ML IJ SOLN
INTRAMUSCULAR | Status: AC
  Filled 2022-03-27: qty 2

## 2022-03-27 MED ORDER — ONDANSETRON HCL 4 MG/2ML IJ SOLN
INTRAMUSCULAR | Status: DC | PRN
  Administered 2022-03-27: 4 mg via INTRAVENOUS

## 2022-03-27 MED ORDER — ORAL CARE MOUTH RINSE: 15.0000 mL | OROMUCOSAL | Status: DC | PRN

## 2022-03-27 MED ORDER — PROPOFOL 1000 MG/100ML IV EMUL
INTRAVENOUS | Status: AC
  Filled 2022-03-27: qty 100

## 2022-03-27 MED ORDER — ACETAMINOPHEN 10 MG/ML IV SOLN
INTRAVENOUS | Status: AC
  Filled 2022-03-27: qty 100

## 2022-03-27 MED ORDER — METHOCARBAMOL 1000 MG/10ML IJ SOLN
500.0000 mg | Freq: Four times a day (QID) | INTRAVENOUS | Status: DC | PRN
  Administered 2022-03-27: 500 mg via INTRAVENOUS
  Filled 2022-03-27: qty 500

## 2022-03-27 MED ORDER — HYDROMORPHONE HCL 1 MG/ML IJ SOLN
INTRAMUSCULAR | Status: AC
  Filled 2022-03-27: qty 1

## 2022-03-27 MED ORDER — SODIUM CHLORIDE FLUSH 0.9 % IV SOLN
INTRAVENOUS | Status: AC
  Filled 2022-03-27: qty 20

## 2022-03-27 MED ORDER — LIDOCAINE HCL (PF) 2 % IJ SOLN
INTRAMUSCULAR | Status: AC
  Filled 2022-03-27: qty 5

## 2022-03-27 MED ORDER — ONDANSETRON HCL 4 MG/2ML IJ SOLN
INTRAMUSCULAR | Status: AC
  Filled 2022-03-27: qty 2

## 2022-03-27 MED ORDER — REMIFENTANIL HCL 1 MG IV SOLR: INTRAVENOUS | Status: DC | PRN

## 2022-03-27 MED ORDER — EPHEDRINE SULFATE (PRESSORS) 50 MG/ML IJ SOLN
INTRAMUSCULAR | Status: DC | PRN
  Administered 2022-03-27: 5 mg via INTRAVENOUS

## 2022-03-27 MED ORDER — PROPOFOL 10 MG/ML IV BOLUS
INTRAVENOUS | Status: AC
  Filled 2022-03-27: qty 20

## 2022-03-27 MED ORDER — ACETAMINOPHEN 10 MG/ML IV SOLN
INTRAVENOUS | Status: DC | PRN
  Administered 2022-03-27: 1000 mg via INTRAVENOUS

## 2022-03-27 MED ORDER — SENNA 8.6 MG PO TABS
1.0000 | ORAL_TABLET | Freq: Two times a day (BID) | ORAL | Status: DC
  Administered 2022-03-27 – 2022-03-30 (×6): 8.6 mg via ORAL
  Filled 2022-03-27 (×6): qty 1

## 2022-03-27 MED ORDER — OXYCODONE HCL 5 MG PO TABS: 5.0000 mg | ORAL_TABLET | ORAL | Status: DC | PRN

## 2022-03-27 MED ORDER — PROPOFOL 500 MG/50ML IV EMUL
INTRAVENOUS | Status: DC | PRN
  Administered 2022-03-27: 150 ug/kg/min via INTRAVENOUS

## 2022-03-27 MED ORDER — VANCOMYCIN HCL 1000 MG IV SOLR
INTRAVENOUS | Status: AC
  Filled 2022-03-27: qty 20

## 2022-03-27 MED ORDER — DOCUSATE SODIUM 100 MG PO CAPS
100.0000 mg | ORAL_CAPSULE | Freq: Two times a day (BID) | ORAL | Status: DC
  Administered 2022-03-27 – 2022-03-30 (×6): 100 mg via ORAL
  Filled 2022-03-27 (×6): qty 1

## 2022-03-27 MED ORDER — POLYETHYLENE GLYCOL 3350 17 G PO PACK: 17.0000 g | PACK | Freq: Every day | ORAL | Status: DC | PRN

## 2022-03-27 MED ORDER — ENOXAPARIN SODIUM 40 MG/0.4ML IJ SOSY
40.0000 mg | PREFILLED_SYRINGE | INTRAMUSCULAR | Status: DC
  Administered 2022-03-28 – 2022-03-30 (×3): 40 mg via SUBCUTANEOUS
  Filled 2022-03-27 (×3): qty 0.4

## 2022-03-27 MED ORDER — MENTHOL 3 MG MT LOZG
1.0000 | LOZENGE | OROMUCOSAL | Status: DC | PRN
  Administered 2022-03-28 (×3): 3 mg via ORAL
  Filled 2022-03-27: qty 9

## 2022-03-27 MED ORDER — CEFAZOLIN SODIUM-DEXTROSE 2-4 GM/100ML-% IV SOLN
INTRAVENOUS | Status: AC
  Filled 2022-03-27: qty 100

## 2022-03-27 MED ORDER — SODIUM CHLORIDE (PF) 0.9 % IJ SOLN
INTRAMUSCULAR | Status: DC | PRN
  Administered 2022-03-27: 60 mL via INTRAMUSCULAR

## 2022-03-27 MED ORDER — HYDROMORPHONE HCL 1 MG/ML IJ SOLN
1.0000 mg | INTRAMUSCULAR | Status: AC | PRN
  Administered 2022-03-27: 1 mg via INTRAVENOUS

## 2022-03-27 MED ORDER — KETAMINE HCL 10 MG/ML IJ SOLN
INTRAMUSCULAR | Status: DC | PRN
  Administered 2022-03-27 (×2): 25 mg via INTRAVENOUS

## 2022-03-27 MED ORDER — CHLORHEXIDINE GLUCONATE 0.12 % MT SOLN
OROMUCOSAL | Status: AC
  Administered 2022-03-27: 15 mL via OROMUCOSAL
  Filled 2022-03-27: qty 15

## 2022-03-27 MED ORDER — BUPIVACAINE LIPOSOME 1.3 % IJ SUSP
INTRAMUSCULAR | Status: AC
  Filled 2022-03-27: qty 20

## 2022-03-27 MED ORDER — REMIFENTANIL HCL 1 MG IV SOLR
INTRAVENOUS | Status: AC
  Filled 2022-03-27: qty 1000

## 2022-03-27 MED ORDER — DEXAMETHASONE SODIUM PHOSPHATE 10 MG/ML IJ SOLN
INTRAMUSCULAR | Status: AC
  Filled 2022-03-27: qty 1

## 2022-03-27 MED ORDER — BUPIVACAINE HCL (PF) 0.5 % IJ SOLN
INTRAMUSCULAR | Status: AC
  Filled 2022-03-27: qty 30

## 2022-03-27 MED ORDER — MORPHINE SULFATE (PF) 2 MG/ML IV SOLN
2.0000 mg | INTRAVENOUS | Status: AC | PRN
  Administered 2022-03-27: 2 mg via INTRAVENOUS
  Filled 2022-03-27: qty 1

## 2022-03-27 MED ORDER — SODIUM CHLORIDE (PF) 0.9 % IJ SOLN
INTRAMUSCULAR | Status: AC
  Filled 2022-03-27: qty 20

## 2022-03-27 MED ORDER — DEXAMETHASONE SODIUM PHOSPHATE 10 MG/ML IJ SOLN
INTRAMUSCULAR | Status: DC | PRN
  Administered 2022-03-27: 10 mg via INTRAVENOUS

## 2022-03-27 MED ORDER — 0.9 % SODIUM CHLORIDE (POUR BTL) OPTIME
TOPICAL | Status: DC | PRN
  Administered 2022-03-27: 500 mL

## 2022-03-27 MED ORDER — SODIUM CHLORIDE 0.9% FLUSH: 3.0000 mL | INTRAVENOUS | Status: DC | PRN

## 2022-03-27 MED ORDER — VANCOMYCIN HCL IN DEXTROSE 1-5 GM/200ML-% IV SOLN
INTRAVENOUS | Status: AC
  Administered 2022-03-27: 1000 mg via INTRAVENOUS
  Filled 2022-03-27: qty 200

## 2022-03-27 MED ORDER — SODIUM CHLORIDE 0.9 % IV SOLN: 250.0000 mL | INTRAVENOUS | Status: DC

## 2022-03-27 MED ORDER — KETAMINE HCL 50 MG/5ML IJ SOSY
PREFILLED_SYRINGE | INTRAMUSCULAR | Status: AC
  Filled 2022-03-27: qty 5

## 2022-03-27 MED ORDER — ACETAMINOPHEN 500 MG PO TABS
1000.0000 mg | ORAL_TABLET | Freq: Four times a day (QID) | ORAL | Status: AC
  Administered 2022-03-28 (×4): 1000 mg via ORAL
  Filled 2022-03-27 (×4): qty 2

## 2022-03-27 MED ORDER — PROPOFOL 10 MG/ML IV BOLUS
INTRAVENOUS | Status: DC | PRN
  Administered 2022-03-27: 150 mg via INTRAVENOUS

## 2022-03-27 MED ORDER — LIDOCAINE HCL (CARDIAC) PF 100 MG/5ML IV SOSY
PREFILLED_SYRINGE | INTRAVENOUS | Status: DC | PRN
  Administered 2022-03-27: 100 mg via INTRAVENOUS

## 2022-03-27 MED ORDER — MIDAZOLAM HCL 2 MG/2ML IJ SOLN
INTRAMUSCULAR | Status: AC
  Filled 2022-03-27: qty 2

## 2022-03-27 MED ORDER — FENTANYL CITRATE (PF) 100 MCG/2ML IJ SOLN
INTRAMUSCULAR | Status: DC | PRN
  Administered 2022-03-27 (×2): 50 ug via INTRAVENOUS

## 2022-03-27 MED ORDER — KETOROLAC TROMETHAMINE 15 MG/ML IJ SOLN
INTRAMUSCULAR | Status: AC
  Administered 2022-03-27: 15 mg via INTRAVENOUS
  Filled 2022-03-27: qty 1

## 2022-03-27 MED ORDER — SODIUM CHLORIDE 0.9 % IV SOLN: INTRAVENOUS | Status: DC

## 2022-03-27 MED ORDER — OXYCODONE HCL 5 MG PO TABS
10.0000 mg | ORAL_TABLET | ORAL | Status: DC | PRN
  Administered 2022-03-28 – 2022-03-30 (×14): 10 mg via ORAL
  Filled 2022-03-27 (×14): qty 2

## 2022-03-27 MED ORDER — OXYCODONE HCL 5 MG PO TABS
ORAL_TABLET | ORAL | Status: AC
  Administered 2022-03-27: 10 mg via ORAL
  Filled 2022-03-27: qty 2

## 2022-03-27 MED ORDER — MELATONIN 5 MG PO TABS
5.0000 mg | ORAL_TABLET | Freq: Every evening | ORAL | Status: DC | PRN
  Administered 2022-03-27: 5 mg via ORAL
  Filled 2022-03-27: qty 1

## 2022-03-27 MED ORDER — METHOCARBAMOL 500 MG PO TABS
500.0000 mg | ORAL_TABLET | Freq: Four times a day (QID) | ORAL | Status: DC | PRN
  Administered 2022-03-28 – 2022-03-29 (×4): 500 mg via ORAL
  Filled 2022-03-27 (×4): qty 1

## 2022-03-27 MED ORDER — PHENYLEPHRINE HCL-NACL 20-0.9 MG/250ML-% IV SOLN
INTRAVENOUS | Status: DC | PRN
  Administered 2022-03-27: 40 ug/min via INTRAVENOUS

## 2022-03-27 MED ORDER — ONDANSETRON HCL 4 MG/2ML IJ SOLN
4.0000 mg | Freq: Once | INTRAMUSCULAR | Status: AC
  Administered 2022-03-27: 4 mg via INTRAVENOUS

## 2022-03-27 MED ORDER — BUPIVACAINE-EPINEPHRINE (PF) 0.5% -1:200000 IJ SOLN
INTRAMUSCULAR | Status: DC | PRN
  Administered 2022-03-27: 10 mL

## 2022-03-27 MED ORDER — REMIFENTANIL HCL 1 MG IV SOLR
INTRAVENOUS | Status: DC | PRN
  Administered 2022-03-27: .2 ug/kg/min via INTRAVENOUS

## 2022-03-27 MED ORDER — ONDANSETRON HCL 4 MG/2ML IJ SOLN: 4.0000 mg | Freq: Four times a day (QID) | INTRAMUSCULAR | Status: DC | PRN

## 2022-03-27 MED ORDER — SUCCINYLCHOLINE CHLORIDE 200 MG/10ML IV SOSY
PREFILLED_SYRINGE | INTRAVENOUS | Status: AC
  Filled 2022-03-27: qty 10

## 2022-03-27 MED ORDER — HYDROMORPHONE HCL 1 MG/ML IJ SOLN
INTRAMUSCULAR | Status: AC
  Administered 2022-03-27: 1 mg via INTRAVENOUS
  Filled 2022-03-27: qty 1

## 2022-03-27 MED ORDER — OXYCODONE HCL 5 MG/5ML PO SOLN: 5.0000 mg | Freq: Once | ORAL | Status: DC | PRN

## 2022-03-27 MED ORDER — OXYCODONE HCL 5 MG PO TABS: 5.0000 mg | ORAL_TABLET | Freq: Once | ORAL | Status: DC | PRN

## 2022-03-27 MED ORDER — FENTANYL CITRATE (PF) 100 MCG/2ML IJ SOLN
INTRAMUSCULAR | Status: AC
  Administered 2022-03-27: 25 ug via INTRAVENOUS
  Filled 2022-03-27: qty 2

## 2022-03-27 MED ORDER — MIDAZOLAM HCL 2 MG/2ML IJ SOLN
INTRAMUSCULAR | Status: DC | PRN
  Administered 2022-03-27: 2 mg via INTRAVENOUS

## 2022-03-27 MED ORDER — PHENOL 1.4 % MT LIQD: 1.0000 | OROMUCOSAL | Status: DC | PRN

## 2022-03-27 MED ORDER — KETOROLAC TROMETHAMINE 15 MG/ML IJ SOLN
15.0000 mg | Freq: Four times a day (QID) | INTRAMUSCULAR | Status: AC
  Administered 2022-03-28 (×3): 15 mg via INTRAVENOUS
  Filled 2022-03-27 (×3): qty 1

## 2022-03-27 SURGICAL SUPPLY — 91 items
ADH SKN CLS APL DERMABOND .7 (GAUZE/BANDAGES/DRESSINGS) ×1
AGENT HMST KT MTR STRL THRMB (HEMOSTASIS) ×1
APL PRP STRL LF DISP 70% ISPRP (MISCELLANEOUS) ×2
BASIN KIT SINGLE STR (MISCELLANEOUS) ×2 IMPLANT
BUR NEURO DRILL SOFT 3.0X3.8M (BURR) ×2 IMPLANT
CAGE SABLE 10X26 9-17 22D (Cage) IMPLANT
CAP LOCKING SPINE (Cap) IMPLANT
CHLORAPREP W/TINT 26 (MISCELLANEOUS) ×6 IMPLANT
CNTNR SPEC 2.5X3XGRAD LEK (MISCELLANEOUS) ×1
CONT SPEC 4OZ STRL OR WHT (MISCELLANEOUS) ×1
CONTAINER SPEC 2.5X3XGRAD LEK (MISCELLANEOUS) ×2 IMPLANT
CUP MEDICINE 2OZ PLAST GRAD ST (MISCELLANEOUS) ×2 IMPLANT
DERMABOND ADVANCED .7 DNX12 (GAUZE/BANDAGES/DRESSINGS) ×2 IMPLANT
DRAPE 3D C-ARM OEC (DRAPES) IMPLANT
DRAPE C ARM PK CFD 31 SPINE (DRAPES) IMPLANT
DRAPE C-ARMOR (DRAPES) IMPLANT
DRAPE INCISE IOBAN 66X45 STRL (DRAPES) ×2 IMPLANT
DRAPE LAPAROTOMY 100X77 ABD (DRAPES) ×2 IMPLANT
DRAPE MICROSCOPE SPINE 48X150 (DRAPES) IMPLANT
DRAPE SCAN PATIENT (DRAPES) ×2 IMPLANT
DRAPE SURG 17X11 SM STRL (DRAPES) ×4 IMPLANT
DRSG OPSITE POSTOP 3X4 (GAUZE/BANDAGES/DRESSINGS) IMPLANT
DRSG OPSITE POSTOP 4X6 (GAUZE/BANDAGES/DRESSINGS) IMPLANT
DRSG OPSITE POSTOP 4X8 (GAUZE/BANDAGES/DRESSINGS) IMPLANT
DRSG TEGADERM 4X4.75 (GAUZE/BANDAGES/DRESSINGS) IMPLANT
ELECT CAUTERY BLADE TIP 2.5 (TIP) ×1
ELECT EZSTD 165MM 6.5IN (MISCELLANEOUS)
ELECT REM PT RETURN 9FT ADLT (ELECTROSURGICAL) ×1
ELECTRODE CAUTERY BLDE TIP 2.5 (TIP) ×2 IMPLANT
ELECTRODE EZSTD 165MM 6.5IN (MISCELLANEOUS) IMPLANT
ELECTRODE REM PT RTRN 9FT ADLT (ELECTROSURGICAL) ×2 IMPLANT
EX-PIN ORTHOLOCK NAV 4X150 (PIN) IMPLANT
FEE INTRAOP CADWELL SUPPLY NCS (MISCELLANEOUS) IMPLANT
FEE INTRAOP MONITOR IMPULS NCS (MISCELLANEOUS) IMPLANT
GAUZE 4X4 16PLY ~~LOC~~+RFID DBL (SPONGE) ×2 IMPLANT
GLOVE BIOGEL PI IND STRL 6.5 (GLOVE) ×4 IMPLANT
GLOVE SURG SYN 6.5 ES PF (GLOVE) ×2 IMPLANT
GLOVE SURG SYN 6.5 PF PI (GLOVE) ×4 IMPLANT
GLOVE SURG SYN 8.5  E (GLOVE) ×4
GLOVE SURG SYN 8.5 E (GLOVE) ×4 IMPLANT
GLOVE SURG SYN 8.5 PF PI (GLOVE) ×8 IMPLANT
GOWN SRG LRG LVL 4 IMPRV REINF (GOWNS) ×6 IMPLANT
GOWN SRG XL LVL 3 NONREINFORCE (GOWNS) ×2 IMPLANT
GOWN STRL NON-REIN TWL XL LVL3 (GOWNS) ×1
GOWN STRL REIN LRG LVL4 (GOWNS) ×3
GRADUATE 1200CC STRL 31836 (MISCELLANEOUS) IMPLANT
HEMOVAC 400CC 10FR (MISCELLANEOUS) IMPLANT
HOLDER FOLEY CATH W/STRAP (MISCELLANEOUS) IMPLANT
INTERBODY SABLE 10X26 7-14 15D (Miscellaneous) IMPLANT
INTRAOP CADWELL SUPPLY FEE NCS (MISCELLANEOUS)
INTRAOP DISP SUPPLY FEE NCS (MISCELLANEOUS)
INTRAOP MONITOR FEE IMPULS NCS (MISCELLANEOUS)
INTRAOP MONITOR FEE IMPULSE (MISCELLANEOUS)
JET LAVAGE IRRISEPT WOUND (IRRIGATION / IRRIGATOR) ×1
K-WIRE 1.6 NITINOL SHARP TIP (WIRE) ×6
KIT PREVENA INCISION MGT 13 (CANNISTER) IMPLANT
KIT SPINAL PRONEVIEW (KITS) ×2 IMPLANT
KNIFE BAYONET SHORT DISCETOMY (MISCELLANEOUS) IMPLANT
KWIRE 1.6 NITINOL SHARP TIP (WIRE) IMPLANT
LAVAGE JET IRRISEPT WOUND (IRRIGATION / IRRIGATOR) IMPLANT
MANIFOLD NEPTUNE II (INSTRUMENTS) ×2 IMPLANT
MARKER SKIN DUAL TIP RULER LAB (MISCELLANEOUS) ×2 IMPLANT
MARKER SPHERE PSV REFLC 13MM (MARKER) ×14 IMPLANT
NDL SAFETY ECLIP 18X1.5 (MISCELLANEOUS) ×2 IMPLANT
NS IRRIG 1000ML POUR BTL (IV SOLUTION) ×2 IMPLANT
NS IRRIG 500ML POUR BTL (IV SOLUTION) IMPLANT
PACK LAMINECTOMY NEURO (CUSTOM PROCEDURE TRAY) ×2 IMPLANT
PUTTY DBM PROPEL LRG (Putty) IMPLANT
ROD CREO CURVED 5.5X85 (Rod) IMPLANT
ROD SPINAL 5.5X75 (Rod) IMPLANT
SCREW CREO 6.5X45 FEN (Screw) IMPLANT
SCREW CREO MIS 30 TULIP (Screw) IMPLANT
SOLUTION IRRIG SURGIPHOR (IV SOLUTION) ×2 IMPLANT
SPONGE GAUZE 2X2 8PLY STRL LF (GAUZE/BANDAGES/DRESSINGS) ×2 IMPLANT
STAPLER SKIN PROX 35W (STAPLE) IMPLANT
SURGIFLO W/THROMBIN 8M KIT (HEMOSTASIS) ×2 IMPLANT
SUT DVC VLOC 3-0 CL 6 P-12 (SUTURE) ×2 IMPLANT
SUT ETHILON 3-0 FS-10 30 BLK (SUTURE) ×2
SUT VIC AB 0 CT1 27 (SUTURE) ×3
SUT VIC AB 0 CT1 27XCR 8 STRN (SUTURE) ×2 IMPLANT
SUT VIC AB 2-0 CT1 18 (SUTURE) ×2 IMPLANT
SUTURE EHLN 3-0 FS-10 30 BLK (SUTURE) IMPLANT
SYR 10ML LL (SYRINGE) ×2 IMPLANT
SYR 30ML LL (SYRINGE) ×2 IMPLANT
TOWEL OR 17X26 4PK STRL BLUE (TOWEL DISPOSABLE) ×2 IMPLANT
TRAP FLUID SMOKE EVACUATOR (MISCELLANEOUS) ×2 IMPLANT
TRAY FOLEY SLVR 16FR LF STAT (SET/KITS/TRAYS/PACK) IMPLANT
TROCAR INSERT W/PEDICLE NDL (TROCAR) IMPLANT
TROCAR INSERT W/PEDICLE NDLE (TROCAR)
TUBING CONNECTING 10 (TUBING) IMPLANT
WATER STERILE IRR 1000ML POUR (IV SOLUTION) ×2 IMPLANT

## 2022-03-27 NOTE — Interval H&P Note (Signed)
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  The various methods of treatment have been discussed with the patient and family. After consideration of risks, benefits and other options for treatment, the patient has consented to  Procedure(s): L4-S1 MINIMALLY INVASIVE (MIS) TRANSFORAMINAL LUMBAR INTERBODY FUSION (TLIF) (N/A) APPLICATION OF INTRAOPERATIVE CT SCAN (N/A) as a surgical intervention.  The patient's history has been reviewed, patient examined, no change in status, stable for surgery.  I have reviewed the patient's chart and labs.  Questions were answered to the patient's satisfaction.    Heart sounds normal no MRG. Chest Clear to Auscultation Bilaterally.    Glanda Spanbauer

## 2022-03-27 NOTE — Op Note (Signed)
Indications: 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  He failed conservative management prompting surgical intervention.  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   Preoperative Note:   Risks of surgery discussed include: infection, bleeding, stroke, coma, death, paralysis, CSF leak, nerve/spinal cord injury, numbness, tingling, weakness, complex regional pain syndrome, recurrent stenosis and/or disc herniation, vascular injury, development of instability, neck/back pain, need for further surgery, persistent symptoms, development of deformity, and the risks of anesthesia. The patient understood these risks and agreed to proceed.  Operative Note:  1. Transforaminal Lumbar Interbody Fusion L4/5 and L5/S1 2. Posterolateral arthrodesis L4 to LS1 3. Posterior segmental instrumentation L4 to S1 using Globus Creo 4. Use of stereotaxis ([employer]rain[employer]) 5. Harvesting of autograft via the same incision 6. Placement of a biomechanical device (Globus Sable) at L4/5 and L5/S1 for anterior arthrodesis  The patient was brought to the Operating Room, intubated and turned into the prone position. All pressure points were checked and double checked. The patient was prepped and draped in the standard fashion. A full timeout was performed. Preoperative antibiotics were given.   After draping, the stereotactic array was placed.  Stereotactic imaging was acquired and registered to the [place] system.  The image guidance system was used to choose bilateral [place] incisions. The incisions were injected with local anesthetic.  Each incision was opened with a scalpel, bovie  electrocautery was used to open the fascia.  Using stereotactic guidance, each pedicle from L4-S1 was cannulated and K wire secured.  We then placed pedicle screws over each K wire. 6.5x24mm Globus Creo screws were used bilaterally at each level.  After placement of pedicle screws, we then turned our attention to transforaminal lumbar interbody fusion.  A tubular retractor was advanced over the right L4/5 facet. The microscope was brought into the field.  The right L4/5 facet was removed with osteotomes and the drill, and handed off for preparation as autograft. The traversing and exiting nerve roots on the right were identified and protected. The disc was opened using a scalpel. After incising the disc space, we took a combination of pituitary rongeurs, Kerrison rongeurs, disc scrapers, and curettes to remove a majority of the disc material.  We prepared the end plates for accepting the interbody fusion.  We removed the cartilaginous plate, preserved the cortical endplate if possible during this procedure.  The disc space was irrigated.  The Globus Sable biomechanical device was inserted, then backfilled with a mixture of allograft and autograft. During placement, the nerve roots and dural sac were carefully protected without any leaks identified. After placement of the device, the canal was fully inspected and all nerve roots were checked to ensure full decompression.  The retractor was removed.  After placement of pedicle screws, we then turned our attention to transforaminal lumbar interbody fusion.  A tubular retractor was advanced over the left L5/S1 facet. The microscope was brought into the field.  The left L5/S1 facet was removed with osteotomes and the drill, and handed off for preparation as autograft. The traversing and exiting nerve roots on the left were identified and protected. The disc was opened using a scalpel. After incising the disc space, we took  a combination of pituitary rongeurs,  Kerrison rongeurs, disc scrapers, and curettes to remove a majority of the disc material.  We prepared the end plates for accepting the interbody fusion.  We removed the cartilaginous plate, preserved the cortical endplate if possible during this procedure.  The disc space was irrigated.  The Globus Sable biomechanical device was inserted, then backfilled with a mixture of allograft and autograft. During placement, the nerve roots and dural sac were carefully protected without any leaks identified. After placement of the device, the canal was fully inspected and all nerve roots were checked to ensure full decompression.  The retractor was removed.  Rods measured and placed. The rods were secured using locking caps to manufacturer's specifications. CT scan was taken to confirm instrumentation placement. The wound was copiously irrigated, then the posterior elements were prepared for arthrodesis.  A mixture of allograft and autograft was placed over the decorticated surfaces for arthrodesis.   A subfascial drain was placed on each side.  After hemostasis, the wound was closed in layers with 0 and 2-0 vicryl. 3-0 monocryl and dermabond was applied to the incision. A sterile dressing was placed.  The patient was then flipped supine and extubated with incident. All counts were correct times 2 at the end of the case. No immediate complications were noted.  Manning Charity PA assisted in the entire procedure. An assistant was required for this procedure due to the complexity.  The assistant provided assistance in tissue manipulation and suction, and was required for the successful and safe performance of the procedure. I performed the critical portions of the procedure.   Venetia Night MD

## 2022-03-27 NOTE — Progress Notes (Signed)
Patient alert/awake x4.  Moving all ext, able to push/bend knee's without event. Does state left arm "feels a little better"  Report given to RN for room 230.  Wife at bedside.

## 2022-03-27 NOTE — Anesthesia Preprocedure Evaluation (Addendum)
Anesthesia Evaluation  Patient identified by MRN, date of birth, ID band Patient awake    Reviewed: Allergy & Precautions, H&P , NPO status , Patient's Chart, lab work & pertinent test results, reviewed documented beta blocker date and time   Airway Mallampati: III  TM Distance: >3 FB Neck ROM: full    Dental  (+) Teeth Intact, Dental Advidsory Given   Pulmonary neg shortness of breath, neg COPD   Pulmonary exam normal        Cardiovascular Exercise Tolerance: Good hypertension, (-) angina (-) Past MI and (-) CABG Normal cardiovascular exam Rhythm:regular Rate:Normal     Neuro/Psych negative neurological ROS  negative psych ROS   GI/Hepatic negative GI ROS, Neg liver ROS,,,  Endo/Other  negative endocrine ROS    Renal/GU negative Renal ROS  negative genitourinary   Musculoskeletal   Abdominal   Peds  Hematology negative hematology ROS (+)   Anesthesia Other Findings History reviewed. No pertinent past medical history. Past Surgical History: No date: HERNIA REPAIR; Left     Comment:  inguinal hernia BMI    Body Mass Index: 29.75 kg/m     Reproductive/Obstetrics negative OB ROS                             Anesthesia Physical Anesthesia Plan  ASA: 2  Anesthesia Plan: General ETT   Post-op Pain Management:    Induction: Intravenous  PONV Risk Score and Plan: 3 and Propofol infusion, TIVA, Midazolam, Dexamethasone and Ondansetron  Airway Management Planned: Oral ETT  Additional Equipment:   Intra-op Plan:   Post-operative Plan: Extubation in OR  Informed Consent: I have reviewed the patients History and Physical, chart, labs and discussed the procedure including the risks, benefits and alternatives for the proposed anesthesia with the patient or authorized representative who has indicated his/her understanding and acceptance.     Dental Advisory Given  Plan Discussed  with: CRNA  Anesthesia Plan Comments: (Patient consented for risks of anesthesia including but not limited to:  - adverse reactions to medications - damage to eyes, teeth, lips or other oral mucosa - nerve damage due to positioning  - sore throat or hoarseness - Damage to heart, brain, nerves, lungs, other parts of body or loss of life  Patient voiced understanding.)        Anesthesia Quick Evaluation

## 2022-03-27 NOTE — Anesthesia Procedure Notes (Addendum)
Procedure Name: Intubation Date/Time: 03/27/2022 2:37 PM  Performed by: Jasper Loser, RNPre-anesthesia Checklist: Patient identified, Patient being monitored, Timeout performed, Emergency Drugs available and Suction available Patient Re-evaluated:Patient Re-evaluated prior to induction Oxygen Delivery Method: Circle system utilized Preoxygenation: Pre-oxygenation with 100% oxygen Induction Type: IV induction Ventilation: Mask ventilation without difficulty Laryngoscope Size: Miller and 2 Grade View: Grade I Tube type: Oral Tube size: 7.5 mm Number of attempts: 1 Airway Equipment and Method: Stylet Placement Confirmation: ETT inserted through vocal cords under direct vision, positive ETCO2 and breath sounds checked- equal and bilateral Secured at: 22 cm Tube secured with: Tape Dental Injury: Teeth and Oropharynx as per pre-operative assessment

## 2022-03-27 NOTE — Transfer of Care (Signed)
Immediate Anesthesia Transfer of Care Note  Patient: Erik Wallace  Procedure(s) Performed: L4-S1 MINIMALLY INVASIVE (MIS) TRANSFORAMINAL LUMBAR INTERBODY FUSION (TLIF) (Spine Lumbar) APPLICATION OF INTRAOPERATIVE CT SCAN (Spine Lumbar)  Patient Location: PACU  Anesthesia Type:General  Level of Consciousness: drowsy  Airway & Oxygen Therapy: Patient Spontanous Breathing and Patient connected to face mask oxygen  Post-op Assessment: Report given to RN  Post vital signs: stable  Last Vitals:  Vitals Value Taken Time  BP    Temp    Pulse 93 03/27/22 1908  Resp    SpO2 97 % 03/27/22 1908  Vitals shown include unvalidated device data.  Last Pain:  Vitals:   03/27/22 1311  TempSrc: Temporal  PainSc: 10-Worst pain ever         Complications: No notable events documented.

## 2022-03-27 NOTE — Progress Notes (Signed)
Patient awake/alert x4.  Pre-op pain level 10/10. Post op pain after surgery with pain medication administered remains 9-10/10.  Discussed with Dr. Myer Haff, patient to be admitted to [facility]. X2 hemovac drains in place, dressing x3 area's lumbar area dermabond and honeycomb. Indwelling foley catheter in place. Wife at bedside. Will continue with pain management.

## 2022-03-28 ENCOUNTER — Encounter: Payer: Self-pay | Admitting: Neurosurgery

## 2022-03-28 NOTE — Discharge Instructions (Signed)
NEUROSURGERY DISCHARGE INSTRUCTIONS  Admission diagnosis: S/P lumbar fusion [Z98.1]  Operative procedure: L4-S1 TLIF  What to do after you leave the hospital:  Recommended diet: regular diet. Increase protein intake to promote wound healing.  Recommended activity: no lifting, driving, or strenuous exercise for 4 weeks . You should walk multiple times per day  Special Instructions  No straining, no heavy lifting > 10lbs x 4 weeks.  Keep incision area clean and dry. May shower in 2 days. No baths or pools for 6 weeks.  Please remove dressing tomorrow, no need to apply a bandage afterwards  You have no sutures to remove, the skin is closed with adhesive  Please take pain medications as directed. Take a stool softener if on pain medications   Please Report any of the following: Nausea or Vomiting, Temperature is greater than 101.74F (38.1C) degrees, Dizziness, Abdominal Pain, Difficulty Breathing or Shortness of Breath, Inability to Eat, drink Fluids, or Take medications, Bleeding, swelling, or drainage from surgical incision sites, New numbness or weakness, and Bowel or bladder dysfunction to the neurosurgeon on call at [phone]  Additional Follow up appointments Please follow up with Drake Leach PA-C in [place] [facility] as scheduled in 2-3 weeks   Please see below for scheduled appointments:  Future Appointments  Date Time Provider Department Center  04/11/2022 10:00 AM Drake Leach, PA-C [facility] None  05/09/2022  9:45 AM Venetia Night, MD [facility] None  06/18/2022  9:45 AM Venetia Night, MD [facility] None

## 2022-03-28 NOTE — Progress Notes (Signed)
    Attending Progress Note  History: Erik Wallace is s/p L4-S1 TLIF for bilateral radiculopathy.  POD1: NAEO. Pt reports improved leg pain with ambulating this morning. He endorses new bilateral thigh numbness and numbness in his right pinky which were not present pre-op.   Physical Exam: Vitals:   03/28/22 0415 03/28/22 0735  BP: (!) 146/96 (!) 166/104  Pulse: 89 93  Resp: 17 18  Temp: 97.9 F (36.6 C) 97.8 F (36.6 C)  SpO2: 97% 96%    AA Ox3 CNI  Strength:5/5 throughout BLE   Incisions covered with post-op bandage  HV output: R 90, L 70  Data:  Other tests/results: none  Assessment/Plan:  Erik Wallace is a 56 y.o s/p L4-S1 TLIF for chronic radiculopathy with improvement of pain post-op   - mobilize - pain control - DVT prophylaxis - please remove foley - will keep HV drains for now and reevaluate. - PTOT  Manning Charity PA-C Department of Neurosurgery

## 2022-03-28 NOTE — TOC Initial Note (Signed)
Transition of Care [facility]) - Initial/Assessment Note    Patient Details  Name: Erik Wallace MRN: [id number] Date of Birth: 26-Sep-1965  Transition of Care [facility]) CM/SW Contact:    Erik Wallace, Erik Wallace Phone Number: 03/28/2022, 2:34 PM  Clinical Narrative:                    TOC met with the patient at bedside. He does not have a PCP. His pharmacy is [facility] in [place]. His spouse Erik Wallace is his emergency contact. [phone].  His workers [employer] is [employer] in [place]. Erik Wallace and Erik Wallace work for [employer]. His employer is [employer].     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)  Activities of [place] Devices/Equipment: None ADL Screening (condition at time of admission) Patient's cognitive ability adequate to safely complete daily activities?: Yes Is the patient deaf or have difficulty hearing?: No Does the patient have difficulty seeing, even when wearing glasses/contacts?: No Does the patient have difficulty concentrating, remembering, or making decisions?: No Patient able to express need for assistance with ADLs?: Yes Does the patient have difficulty dressing or bathing?: No Independently performs ADLs?: Yes (appropriate for developmental age) Does the patient have difficulty walking or climbing stairs?: Yes Weakness of Legs: Both Weakness of Arms/Hands: Right  Permission  Sought/Granted Permission sought to share information with : Other (comment), Family Supports, Case Manager ([employer]- Erik Wallace and Erik Wallace from workers comp) Software engineer granted to share information with : Yes, Psychiatric nurse Information with NAME: [employer]           Emotional Assessment Appearance:: Appears stated age     Orientation: : Oriented to Self, Oriented to Place, Oriented to  Time, Oriented to Situation      Admission diagnosis:  S/P lumbar fusion [Z98.1] Patient Active Problem List   Diagnosis Date Noted   S/P lumbar fusion 03/27/2022   Chronic bilateral low back pain with bilateral sciatica 03/27/2022   Hypertensive urgency 07/18/2021   Lumbar radiculopathy 07/18/2021   Spinal stenosis at L4-L5 level 07/18/2021   PCP:  Pcp, No Pharmacy:   [id number] [place] [address] [id number]: [phone]: [phone]    Social Determinants of Health (SDOH) Interventions    Readmission Risk Interventions     No data to display

## 2022-03-28 NOTE — Evaluation (Signed)
Physical Therapy Evaluation Patient Details Name: Erik Wallace MRN: [id number] DOB: 09/04/1965 Today's Date: 03/28/2022  History of Present Illness  Erik Wallace is a 56 yo male s/p L4-S1 TLIF for bilateral radiculopathy  Clinical Impression  Pt is a pleasant 56 year old male who was admitted for L4-S1 TLIF for bilateral radiculopathy. Pt performs transfers with mod I and ambulation with supervision and no AD. Reviewed back precautions, no brace required. Pt demonstrates all bed mobility/transfers/ambulation at baseline level. Pt does not require any further PT needs at this time. Pt will be dc in house and does not require follow up. RN aware. Will dc current orders. Educated to continue ambulation in hallway 3x/day and to follow up with OP PT when MD advises.       Recommendations for follow up therapy are one component of a multi-disciplinary discharge planning process, led by the attending physician.  Recommendations may be updated based on patient status, additional functional criteria and insurance authorization.  Follow Up Recommendations Outpatient PT      Assistance Recommended at Discharge PRN  Patient can return home with the following       Equipment Recommendations None recommended by PT  Recommendations for Other Services       Functional Status Assessment Patient has not had a recent decline in their functional status     Precautions / Restrictions Precautions Precautions: Back Precaution Booklet Issued: Yes (comment) Restrictions Weight Bearing Restrictions: No      Mobility  Bed Mobility               General bed mobility comments: received in chair pre/post session    Transfers Overall transfer level: Modified independent Equipment used: None               General transfer comment: safe technique    Ambulation/Gait Ambulation/Gait assistance: Supervision Gait Distance (Feet): 200 Feet Assistive device: None Gait  Pattern/deviations: Step-through pattern       General Gait Details: slow gait speed with short reciprocal gait pattern. No AD used.  Stairs Stairs: Yes Stairs assistance: Supervision Stair Management: One rail Left, Step to pattern, Forwards Number of Stairs: 3 General stair comments: up/down with use of 1 railing and step to gait pattern. Safe technique. Pt feels confident to enter/exit home  Wheelchair Mobility    Modified Rankin (Stroke Patients Only)       Balance Overall balance assessment: Needs assistance Sitting-balance support: No upper extremity supported, Feet supported Sitting balance-Leahy Scale: Normal     Standing balance support: No upper extremity supported, During functional activity Standing balance-Leahy Scale: Good                               Pertinent Vitals/Pain Pain Assessment Pain Assessment: 0-10 Pain Score: 9  Pain Location: back Pain Descriptors / Indicators: Discomfort, Grimacing Pain Intervention(s): Limited activity within patient's tolerance, Repositioned, RN gave pain meds during session    Home Living Family/patient expects to be discharged to:: Private residence Living Arrangements: Spouse/significant other Available Help at Discharge: Family;Available 24 hours/day Type of Home: House Home Access: Stairs to enter Entrance Stairs-Rails: Can reach both Entrance Stairs-Number of Steps: 3   Home Layout: One level Home Equipment: Toilet riser;Hand held shower head;Grab bars - tub/shower      Prior Function Prior Level of Function : Independent/Modified Independent             Mobility Comments:   hasn't been working since last year since he hurt his back. Reports no falls. ADLs Comments: indep     Hand Dominance   Dominant Hand: Right    Extremity/Trunk Assessment   Upper Extremity Assessment Upper Extremity Assessment: Overall WFL for tasks assessed    Lower Extremity Assessment Lower Extremity  Assessment: Overall WFL for tasks assessed       Communication   Communication: No difficulties  Cognition Arousal/Alertness: Awake/alert Behavior During Therapy: WFL for tasks assessed/performed Overall Cognitive Status: Within Functional Limits for tasks assessed                                          General Comments      Exercises     Assessment/Plan    PT Assessment All further PT needs can be met in the next venue of care  PT Problem List Pain       PT Treatment Interventions      PT Goals (Current goals can be found in the Care Plan section)  Acute Rehab PT Goals Patient Stated Goal: to go home PT Goal Formulation: All assessment and education complete, DC therapy Time For Goal Achievement: 03/28/22 Potential to Achieve Goals: Good    Frequency       Co-evaluation               AM-PAC PT "6 Clicks" Mobility  Outcome Measure Help needed turning from your back to your side while in a flat bed without using bedrails?: None Help needed moving from lying on your back to sitting on the side of a flat bed without using bedrails?: None Help needed moving to and from a bed to a chair (including a wheelchair)?: None Help needed standing up from a chair using your arms (e.g., wheelchair or bedside chair)?: None Help needed to walk in hospital room?: None Help needed climbing 3-5 steps with a railing? : None 6 Click Score: 24    End of Session   Activity Tolerance: Patient tolerated treatment well Patient left: in chair Nurse Communication: Mobility status PT Visit Diagnosis: Pain Pain - Right/Left:  (back) Pain - part of body:  (back)    Time: 1441-1500 PT Time Calculation (min) (ACUTE ONLY): 19 min   Charges:   PT Evaluation $PT Eval Low Complexity: 1 Low PT Treatments $Gait Training: 8-22 mins        Erik Wallace, PT, DPT, GCS [phone]   Erik Wallace 03/28/2022, 4:48 PM

## 2022-03-28 NOTE — Anesthesia Postprocedure Evaluation (Signed)
Anesthesia Post Note  Patient: Erik Wallace  Procedure(s) Performed: L4-S1 MINIMALLY INVASIVE (MIS) TRANSFORAMINAL LUMBAR INTERBODY FUSION (TLIF) (Spine Lumbar) APPLICATION OF INTRAOPERATIVE CT SCAN (Spine Lumbar)  Patient location during evaluation: PACU Anesthesia Type: General Level of consciousness: awake and alert Pain management: pain level controlled Vital Signs Assessment: post-procedure vital signs reviewed and stable Respiratory status: spontaneous breathing, nonlabored ventilation, respiratory function stable and patient connected to nasal cannula oxygen Cardiovascular status: blood pressure returned to baseline and stable Postop Assessment: no apparent nausea or vomiting Anesthetic complications: no   No notable events documented.   Last Vitals:  Vitals:   03/28/22 0415 03/28/22 0735  BP: (!) 146/96 (!) 166/104  Pulse: 89 93  Resp: 17 18  Temp: 36.6 C 36.6 C  SpO2: 97% 96%    Last Pain:  Vitals:   03/28/22 0735  TempSrc: Oral  PainSc: 6                  Lenard Simmer

## 2022-03-28 NOTE — Evaluation (Signed)
Occupational Therapy Evaluation Patient Details Name: Erik Wallace MRN: [id number] DOB: Sep 07, 1965 Today's Date: 03/28/2022   History of Present Illness Erik Wallace is a 56 yo male s/p L4-S1 TLIF for bilateral radiculopathy   Clinical Impression   [name] was seen for OT evaluation this date. Prior to hospital admission, pt was IND for mobility and ADLs. Pt lives with spouse in home c 3 STE. Pt currently requires SUPERVISION simulated toilet t/f and standing grooming tasks, cues for adapted strategies to maintain back pcns. MOD I don/doff B socks, increased time to achieve figure 4 position. Pt educated in functional application of back precautions, log roll technique,and DME recs. Pt verbalized understanding of all education/training provided. Handout provided to support recall and carry over of learned precautions/techniques. All education complete, will sign off. Upon hospital discharge, recommend no OT follow up.    Recommendations for follow up therapy are one component of a multi-disciplinary discharge planning process, led by the attending physician.  Recommendations may be updated based on patient status, additional functional criteria and insurance authorization.   Follow Up Recommendations  No OT follow up     Assistance Recommended at Discharge Intermittent Supervision/Assistance  Patient can return home with the following Help with stairs or ramp for entrance;Assistance with cooking/housework;A little help with bathing/dressing/bathroom    Functional Status Assessment  Patient has had a recent decline in their functional status and demonstrates the ability to make significant improvements in function in a reasonable and predictable amount of time.  Equipment Recommendations  Tub/shower seat    Recommendations for Other Services       Precautions / Restrictions Precautions Precautions: Back Restrictions Weight Bearing Restrictions: No      Mobility Bed  Mobility Overal bed mobility: Modified Independent             General bed mobility comments: log roll    Transfers Overall transfer level: Modified independent Equipment used: None                      Balance Overall balance assessment: Needs assistance Sitting-balance support: No upper extremity supported, Feet supported Sitting balance-Leahy Scale: Normal     Standing balance support: No upper extremity supported, During functional activity Standing balance-Leahy Scale: Good                             ADL either performed or assessed with clinical judgement   ADL Overall ADL's : Needs assistance/impaired                                       General ADL Comments: SUPERVISION simulated toilet t/f and standing grooming tasks, cues for adapted strategies to maintain back pcns. MOD I don/doff B socks, increased time to achieve figure 4 position      Pertinent Vitals/Pain Pain Assessment Pain Assessment: 0-10 Pain Score: 8  Pain Location: back Pain Descriptors / Indicators: Discomfort, Grimacing Pain Intervention(s): Premedicated before session, Repositioned     Hand Dominance Right   Extremity/Trunk Assessment Upper Extremity Assessment Upper Extremity Assessment: Overall WFL for tasks assessed   Lower Extremity Assessment Lower Extremity Assessment: Generalized weakness       Communication Communication Communication: No difficulties   Cognition Arousal/Alertness: Awake/alert Behavior During Therapy: WFL for tasks assessed/performed Overall Cognitive Status: Within Functional Limits for tasks assessed  Home Living Family/patient expects to be discharged to:: Private residence Living Arrangements: Spouse/significant other Available Help at Discharge: Family;Available 24 hours/day Type of Home: House Home Access: Stairs to enter [employer] of Steps: 3 Entrance Stairs-Rails: Can reach both Home Layout: One level     Bathroom Shower/Tub: Walk-in shower         Home Equipment: Toilet riser;Hand held shower head;Grab bars - tub/shower          Prior Functioning/Environment Prior Level of Function : Independent/Modified Independent                        OT Problem List: Decreased range of motion;Decreased activity tolerance;Impaired balance (sitting and/or standing)         OT Goals(Current goals can be found in the care plan section) Acute Rehab OT Goals Patient Stated Goal: to go home OT Goal Formulation: With patient/family Time For Goal Achievement: 04/11/22 Potential to Achieve Goals: Good   AM-PAC OT "6 Clicks" Daily Activity     Outcome Measure Help from another person eating meals?: None Help from another person taking care of personal grooming?: A Little Help from another person toileting, which includes using toliet, bedpan, or urinal?: A Little Help from another person bathing (including washing, rinsing, drying)?: A Little Help from another person to put on and taking off regular upper body clothing?: None Help from another person to put on and taking off regular lower body clothing?: None 6 Click Score: 21   End of Session Nurse Communication: Mobility status  Activity Tolerance: Patient tolerated treatment well Patient left: in chair;with call bell/phone within reach;with family/visitor present  OT Visit Diagnosis: Unsteadiness on feet (R26.81);Muscle weakness (generalized) (M62.81)                Time: 4680-3212 OT Time Calculation (min): 25 min Charges:  OT General Charges $OT Visit: 1 Visit OT Evaluation $OT Eval Low Complexity: 1 Low OT Treatments $Self Care/Home Management : 8-22 mins  Kathie Dike, M.S. OTR/L  03/28/22, 12:52 PM  ascom [phone]

## 2022-03-29 LAB — CBC
HCT: 42.7 % (ref 39.0–52.0)
Hemoglobin: 15 g/dL (ref 13.0–17.0)
MCH: 32.1 pg (ref 26.0–34.0)
MCHC: 35.1 g/dL (ref 30.0–36.0)
MCV: 91.4 fL (ref 80.0–100.0)
Platelets: 239 10*3/uL (ref 150–400)
RBC: 4.67 MIL/uL (ref 4.22–5.81)
RDW: 12.5 % (ref 11.5–15.5)
WBC: 12 10*3/uL — ABNORMAL HIGH (ref 4.0–10.5)
nRBC: 0 % (ref 0.0–0.2)

## 2022-03-29 MED ORDER — METHOCARBAMOL 500 MG PO TABS
500.0000 mg | ORAL_TABLET | Freq: Four times a day (QID) | ORAL | Status: DC
  Administered 2022-03-29 – 2022-03-30 (×4): 500 mg via ORAL
  Filled 2022-03-29 (×4): qty 1

## 2022-03-29 MED ORDER — LIDOCAINE 5 % EX PTCH
1.0000 | MEDICATED_PATCH | CUTANEOUS | Status: DC
  Administered 2022-03-29: 1 via TRANSDERMAL
  Filled 2022-03-29: qty 1

## 2022-03-29 MED ORDER — METHOCARBAMOL 1000 MG/10ML IJ SOLN: 500.0000 mg | Freq: Four times a day (QID) | INTRAVENOUS | Status: DC

## 2022-03-29 MED ORDER — KETOROLAC TROMETHAMINE 15 MG/ML IJ SOLN
15.0000 mg | Freq: Four times a day (QID) | INTRAMUSCULAR | Status: AC
  Administered 2022-03-29 (×3): 15 mg via INTRAVENOUS
  Filled 2022-03-29 (×2): qty 1

## 2022-03-29 MED ORDER — HYDRALAZINE HCL 20 MG/ML IJ SOLN
2.0000 mg | INTRAMUSCULAR | Status: DC | PRN
  Administered 2022-03-29: 2 mg via INTRAVENOUS
  Filled 2022-03-29: qty 1

## 2022-03-29 NOTE — Plan of Care (Signed)
  Problem: Activity: Goal: Ability to avoid complications of mobility impairment will improve Outcome: Progressing Goal: Will remain free from falls Outcome: Progressing   Problem: Pain Management: Goal: Pain level will decrease Outcome: Not Progressing

## 2022-03-29 NOTE — Progress Notes (Signed)
    Attending Progress Note  History: Erik Wallace is s/p L4-S1 TLIF for bilateral radiculopathy.  POD2: Increased back pain overnight.   POD1: NAEO. Pt reports improved leg pain with ambulating this morning. He endorses new bilateral thigh numbness and numbness in his right pinky which were not present pre-op.   Physical Exam: Vitals:   03/28/22 1923 03/29/22 0518  BP: (!) 156/97 (!) 154/95  Pulse: 80 86  Resp: 20 20  Temp: 98.2 F (36.8 C) 99.6 F (37.6 C)  SpO2: 95% 96%    AA Ox3 CNI  Strength:5/5 throughout BLE   Incisions covered with post-op bandage  HV output: R 15, L 45 overnight   Data:  Other tests/results: none  Assessment/Plan:  Erik Wallace is a 56 y.o s/p L4-S1 TLIF for chronic radiculopathy with improvement of pain post-op   - mobilize - pain control; will make some changes for better pain control this morning - DVT prophylaxis - HV x 2 removed 12/8 - PTOT  Manning Charity PA-C Department of Neurosurgery

## 2022-03-29 NOTE — TOC Progression Note (Signed)
Transition of Care [facility]) - Progression Note    Patient Details  Name: Erik Wallace MRN: [id number] Date of Birth: May 01, 1965  Transition of Care [facility]) CM/SW [place], LCSW Phone Number: 03/29/2022, 1:22 PM  Clinical Narrative:   Met with patient and wife. Discussed PT recommendation for outpatient PT. Patient stated he has already been going to [place] and can continue to go there once he is able. DME recommendation for tub/shower seat. Patient declined. Wife will transport him home at discharge.  Expected Discharge Plan and Services                                                 Social Determinants of Health (SDOH) Interventions    Readmission Risk Interventions     No data to display

## 2022-03-30 ENCOUNTER — Other Ambulatory Visit: Payer: Self-pay | Admitting: Neurosurgery

## 2022-03-30 MED ORDER — BISACODYL 10 MG RE SUPP
10.0000 mg | Freq: Once | RECTAL | Status: DC
  Filled 2022-03-30: qty 1

## 2022-03-30 MED ORDER — OXYCODONE HCL 5 MG PO TABS: 5.0000 mg | ORAL_TABLET | ORAL | 0 refills | Status: AC | PRN

## 2022-03-30 MED ORDER — METHOCARBAMOL 1000 MG PO TABS: 1000.0000 mg | ORAL_TABLET | Freq: Four times a day (QID) | ORAL | 0 refills | Status: DC

## 2022-03-30 MED ORDER — METHOCARBAMOL 500 MG PO TABS
1000.0000 mg | ORAL_TABLET | Freq: Four times a day (QID) | ORAL | Status: DC
  Administered 2022-03-30 (×2): 1000 mg via ORAL
  Filled 2022-03-30 (×2): qty 2

## 2022-03-30 MED ORDER — LACTULOSE 10 GM/15ML PO SOLN
10.0000 g | Freq: Two times a day (BID) | ORAL | Status: DC
  Administered 2022-03-30: 10 g via ORAL
  Filled 2022-03-30: qty 30

## 2022-03-30 MED ORDER — DIAZEPAM 5 MG PO TABS
5.0000 mg | ORAL_TABLET | Freq: Three times a day (TID) | ORAL | Status: DC | PRN
  Administered 2022-03-30: 5 mg via ORAL
  Filled 2022-03-30: qty 1

## 2022-03-30 MED ORDER — DIAZEPAM 5 MG PO TABS: 5.0000 mg | ORAL_TABLET | Freq: Three times a day (TID) | ORAL | 0 refills | Status: DC | PRN

## 2022-03-30 MED ORDER — SENNA 8.6 MG PO TABS: 1.0000 | ORAL_TABLET | Freq: Two times a day (BID) | ORAL | 0 refills | Status: DC

## 2022-03-30 MED ORDER — FLEET ENEMA 7-19 GM/118ML RE ENEM: 1.0000 | ENEMA | Freq: Once | RECTAL | Status: DC

## 2022-03-30 MED ORDER — METHOCARBAMOL 1000 MG/10ML IJ SOLN: 500.0000 mg | Freq: Four times a day (QID) | INTRAVENOUS | Status: DC

## 2022-03-30 NOTE — Progress Notes (Signed)
    Attending Progress Note  History: Erik Wallace is s/p L4-S1 TLIF for bilateral radiculopathy.  POD3: Still having significant spasms.  POD2: Increased back pain overnight.   POD1: NAEO. Pt reports improved leg pain with ambulating this morning. He endorses new bilateral thigh numbness and numbness in his right pinky which were not present pre-op.   Physical Exam: Vitals:   03/29/22 2025 03/30/22 0348  BP: (!) 162/91 (!) 165/96  Pulse: 94 88  Resp: 20 20  Temp: 99.7 F (37.6 C) 98.9 F (37.2 C)  SpO2: 97% 95%    AA Ox3 CNI  Strength:5/5 throughout BLE   Incisions covered with post-op bandage  Data:  Other tests/results: none  Assessment/Plan:  Erik Wallace is a 57 y.o s/p L4-S1 TLIF for chronic radiculopathy with improvement of pain post-op   - mobilize - pain control; will make some changes for better pain control this morning - DVT prophylaxis - HV x 2 removed 12/8 - PTOT - BM today  Venetia Night MD Department of Neurosurgery

## 2022-03-30 NOTE — Discharge Summary (Signed)
Physician Discharge Summary  Patient ID: Erik Wallace MRN: [id number] DOB/AGE: 09-29-1965 56 y.o.  Admit date: 03/27/2022 Discharge date: 03/30/2022  Admission Diagnoses:Active Problems:   Chronic bilateral low back pain with bilateral sciatica  Discharge Diagnoses:  Principal Problem:   S/P lumbar fusion Active Problems:   Chronic bilateral low back pain with bilateral sciatica   Discharged Condition: good  Hospital Course: [name] was admitted to the hospital for surgical intervention.  He underwent L4-S1 transforaminal lumbar interbody fusion.  He tolerated the procedure well.  He was felt to be stable for discharge on postoperative day 3.  Consults: None  Significant Diagnostic Studies: radiology: X-Ray: localization of surgery, placement of implants  Treatments: surgery: L4-S1 TLIF  Discharge Exam: Blood pressure (!) 151/91, pulse 87, temperature 98.4 F (36.9 C), temperature source Oral, resp. rate 16, height 5\' 10"  (1.778 m), weight 95.5 kg, SpO2 96 %. General appearance: alert and cooperative CNI MAEW  Disposition:   Discharge Instructions     Incentive spirometry RT   Complete by: As directed       Allergies as of 03/30/2022   No Known Allergies      Medication List     STOP taking these medications    tiZANidine 4 MG tablet Commonly known as: ZANAFLEX       TAKE these medications    diazepam 5 MG tablet Commonly known as: VALIUM Take 1 tablet (5 mg total) by mouth every 8 (eight) hours as needed for muscle spasms.   MELATONIN PO Take 1 tablet by mouth at bedtime as needed.   Methocarbamol 1000 MG Tabs Take 1,000 mg by mouth 4 (four) times daily.   oxyCODONE 5 MG immediate release tablet Commonly known as: Roxicodone Take 1-2 tablets (5-10 mg total) by mouth every 4 (four) hours as needed for up to 7 days for moderate pain. What changed:  how much to take when to take this   senna 8.6 MG Tabs tablet Commonly known as:  SENOKOT Take 1 tablet (8.6 mg total) by mouth 2 (two) times daily.        Follow-up Information     14/12/2021, PA-C Follow up in 2 week(s).   Specialty: Neurosurgery Why: Appointment is already scheduled. Contact information: [address] [place] [phone]                 Signed: [phone] 03/30/2022, 12:10 PM

## 2022-04-01 NOTE — Telephone Encounter (Signed)
I spoke with the patient. He picked up methocarbamol 500mg  - 2 tabs 4 times daily. This rx is not needed

## 2022-04-01 NOTE — Addendum Note (Signed)
Addended by: Sharlot Gowda on: 04/01/2022 03:09 PM   Modules accepted: Orders

## 2022-04-10 NOTE — Progress Notes (Unsigned)
   REFERRING PHYSICIAN:  No referring provider defined for this encounter.  DOS: 03/27/22 L4-S1 TLIF/PSF  HISTORY OF PRESENT ILLNESS: Erik Wallace is approximately 2 weeks status post L4-S1 TLIF/PSF. Was given valium, robaxin, and oxycodone on discharge from the hospital.   He has mild pain and soreness in his back, but overall feels like surgery was a success! Having itching over incisions. He is taking oxycodone, tylenol, and robaxin.    PHYSICAL EXAMINATION:  General: Patient is well developed, well nourished, calm, collected, and in no apparent distress.   NEUROLOGICAL:  General: In no acute distress.   Awake, alert, oriented to person, place, and time.  Pupils equal round and reactive to light.  Facial tone is symmetric.     Strength:            Side Iliopsoas Quads Hamstring PF DF EHL  R 5 5 5 5 5 5   L 5 5 5 5 5 5    Incisions c/d/i   ROS (Neurologic):  Negative except as noted above  IMAGING: Nothing new to review.   ASSESSMENT/PLAN:  Erik Wallace is doing well s/p above surgery. Treatment options reviewed with patient and following plan made:   - I have advised the patient to lift up to 10 pounds until 6 weeks after surgery (follow up with Dr. ).  - Reviewed wound care.  - No bending, twisting, or lifting.  - Continue on current medications including prn oxycodone and robaxin. - He can drive when he is no longer taking oxycodone.  - Follow up as scheduled in 4 weeks and prn.   BP was elevated, it was initially 186/117. Recheck at the end of the visit was 170/110. Per patient, it has been running high due to pain. He has no symptoms of SOB, CP, blurry vision. He was advised to get a cuff and check his blood pressure at home. If he has any concerns or any of the above symptoms, he will go to ED.   Given him information on getting PCP as well.   Advised to contact the office if any questions or concerns arise.  Tacey Heap PA-C Department of  neurosurgery

## 2022-04-11 ENCOUNTER — Encounter: Payer: Self-pay | Admitting: Orthopedic Surgery

## 2022-04-11 ENCOUNTER — Ambulatory Visit (INDEPENDENT_AMBULATORY_CARE_PROVIDER_SITE_OTHER): Payer: Worker's Compensation | Admitting: Orthopedic Surgery

## 2022-04-11 VITALS — BP 170/110 | HR 84 | Temp 97.9°F | Wt 199.6 lb

## 2022-04-11 DIAGNOSIS — Z981 Arthrodesis status: Secondary | ICD-10-CM

## 2022-04-11 DIAGNOSIS — G8929 Other chronic pain: Secondary | ICD-10-CM

## 2022-04-11 DIAGNOSIS — M5441 Lumbago with sciatica, right side: Secondary | ICD-10-CM

## 2022-04-11 DIAGNOSIS — Z09 Encounter for follow-up examination after completed treatment for conditions other than malignant neoplasm: Secondary | ICD-10-CM

## 2022-04-11 NOTE — Patient Instructions (Signed)
I recommend seeing Dr. Margarita Mail at [facility] (across the hall from [place]) for a PCP. Her number is [phone].

## 2022-05-08 ENCOUNTER — Other Ambulatory Visit: Payer: Self-pay

## 2022-05-08 DIAGNOSIS — Z981 Arthrodesis status: Secondary | ICD-10-CM

## 2022-05-09 ENCOUNTER — Encounter: Payer: Self-pay | Admitting: Neurosurgery

## 2022-05-09 ENCOUNTER — Ambulatory Visit (INDEPENDENT_AMBULATORY_CARE_PROVIDER_SITE_OTHER): Payer: Worker's Compensation | Admitting: Neurosurgery

## 2022-05-09 ENCOUNTER — Ambulatory Visit
Admission: RE | Admit: 2022-05-09 | Discharge: 2022-05-09 | Disposition: A | Discharge status: Home / Self Care | Payer: Commercial Managed Care - PPO | Source: Ambulatory Visit | Attending: Neurosurgery | Admitting: Neurosurgery

## 2022-05-09 ENCOUNTER — Ambulatory Visit
Admission: RE | Admit: 2022-05-09 | Discharge: 2022-05-09 | Disposition: A | Discharge status: Home / Self Care | Payer: Commercial Managed Care - PPO | Attending: Neurosurgery | Admitting: Neurosurgery

## 2022-05-09 VITALS — BP 170/110 | HR 80 | Temp 98.1°F | Wt 194.8 lb

## 2022-05-09 DIAGNOSIS — Z09 Encounter for follow-up examination after completed treatment for conditions other than malignant neoplasm: Secondary | ICD-10-CM

## 2022-05-09 DIAGNOSIS — Z981 Arthrodesis status: Secondary | ICD-10-CM | POA: Insufficient documentation

## 2022-05-09 DIAGNOSIS — M5441 Lumbago with sciatica, right side: Secondary | ICD-10-CM

## 2022-05-09 DIAGNOSIS — G8929 Other chronic pain: Secondary | ICD-10-CM

## 2022-05-09 NOTE — Progress Notes (Signed)
   REFERRING PHYSICIAN:  No referring provider defined for this encounter.  DOS: 03/27/22 L4-S1 TLIF/PSF  HISTORY OF PRESENT ILLNESS: Erik Wallace is status post L4-S1 TLIF/PSF.   He has minimal discomfort.  His back and leg symptoms are much better.  He has some stiffness in his left leg in the mornings.  PHYSICAL EXAMINATION:  General: Patient is well developed, well nourished, calm, collected, and in no apparent distress.   NEUROLOGICAL:  General: In no acute distress.   Awake, alert, oriented to person, place, and time.  Pupils equal round and reactive to light.  Facial tone is symmetric.     Strength:            Side Iliopsoas Quads Hamstring PF DF EHL  R 5 5 5 5 5 5   L 5 5 5 5 5 5    Incisions c/d/i   ROS (Neurologic):  Negative except as noted above  IMAGING: No complications noted  ASSESSMENT/PLAN:  Erik Wallace is doing well s/p above surgery.   I am very pleased with his recovery thus far.  We reviewed his activity limitations.  He is still on light duty including 25 pound lifting restriction and being careful with bending and twisting.  He will start slowly increasing his exercise level.  I will see him back in 6 weeks with x-rays.    Meade Maw, MD  Department of neurosurgery

## 2022-06-11 ENCOUNTER — Emergency Department: Payer: Commercial Managed Care - PPO

## 2022-06-11 ENCOUNTER — Other Ambulatory Visit: Payer: Self-pay

## 2022-06-11 ENCOUNTER — Telehealth: Payer: Self-pay

## 2022-06-11 ENCOUNTER — Observation Stay
Admission: EM | Admit: 2022-06-11 | Discharge: 2022-06-12 | Disposition: A | Discharge status: Home / Self Care | Payer: Commercial Managed Care - PPO | Attending: Emergency Medicine | Admitting: Emergency Medicine

## 2022-06-11 DIAGNOSIS — R7301 Impaired fasting glucose: Secondary | ICD-10-CM | POA: Diagnosis not present

## 2022-06-11 DIAGNOSIS — E785 Hyperlipidemia, unspecified: Secondary | ICD-10-CM

## 2022-06-11 DIAGNOSIS — Z79899 Other long term (current) drug therapy: Secondary | ICD-10-CM | POA: Insufficient documentation

## 2022-06-11 DIAGNOSIS — I639 Cerebral infarction, unspecified: Principal | ICD-10-CM | POA: Diagnosis present

## 2022-06-11 DIAGNOSIS — Z6829 Body mass index (BMI) 29.0-29.9, adult: Secondary | ICD-10-CM | POA: Insufficient documentation

## 2022-06-11 DIAGNOSIS — I10 Essential (primary) hypertension: Secondary | ICD-10-CM | POA: Diagnosis not present

## 2022-06-11 DIAGNOSIS — E663 Overweight: Secondary | ICD-10-CM

## 2022-06-11 DIAGNOSIS — R531 Weakness: Secondary | ICD-10-CM | POA: Diagnosis present

## 2022-06-11 LAB — DIFFERENTIAL
Abs Immature Granulocytes: 0.02 10*3/uL (ref 0.00–0.07)
Basophils Absolute: 0 10*3/uL (ref 0.0–0.1)
Basophils Relative: 0 %
Eosinophils Absolute: 0.1 10*3/uL (ref 0.0–0.5)
Eosinophils Relative: 1 %
Immature Granulocytes: 0 %
Lymphocytes Relative: 37 %
Lymphs Abs: 2.5 10*3/uL (ref 0.7–4.0)
Monocytes Absolute: 0.6 10*3/uL (ref 0.1–1.0)
Monocytes Relative: 8 %
Neutro Abs: 3.6 10*3/uL (ref 1.7–7.7)
Neutrophils Relative %: 54 %

## 2022-06-11 LAB — APTT: aPTT: 28 seconds (ref 24–36)

## 2022-06-11 LAB — COMPREHENSIVE METABOLIC PANEL
ALT: 23 U/L (ref 0–44)
AST: 22 U/L (ref 15–41)
Albumin: 4.6 g/dL (ref 3.5–5.0)
Alkaline Phosphatase: 51 U/L (ref 38–126)
Anion gap: 8 (ref 5–15)
BUN: 20 mg/dL (ref 6–20)
CO2: 25 mmol/L (ref 22–32)
Calcium: 10 mg/dL (ref 8.9–10.3)
Chloride: 105 mmol/L (ref 98–111)
Creatinine, Ser: 0.86 mg/dL (ref 0.61–1.24)
GFR, Estimated: >60 mL/min (ref >60)
Glucose, Bld: 118 mg/dL — ABNORMAL HIGH (ref 70–99)
Potassium: 4.1 mmol/L (ref 3.5–5.1)
Sodium: 138 mmol/L (ref 135–145)
Total Bilirubin: 1.1 mg/dL (ref 0.3–1.2)
Total Protein: 7.8 g/dL (ref 6.5–8.1)

## 2022-06-11 LAB — CBC
HCT: 47.3 % (ref 39.0–52.0)
Hemoglobin: 15.8 g/dL (ref 13.0–17.0)
MCH: 31.2 pg (ref 26.0–34.0)
MCHC: 33.4 g/dL (ref 30.0–36.0)
MCV: 93.3 fL (ref 80.0–100.0)
Platelets: 283 10*3/uL (ref 150–400)
RBC: 5.07 MIL/uL (ref 4.22–5.81)
RDW: 13.7 % (ref 11.5–15.5)
WBC: 6.7 10*3/uL (ref 4.0–10.5)
nRBC: 0 % (ref 0.0–0.2)

## 2022-06-11 LAB — ETHANOL: Alcohol, Ethyl (B): <10 mg/dL (ref <10)

## 2022-06-11 LAB — PROTIME-INR
INR: 1.1 (ref 0.8–1.2)
Prothrombin Time: 13.6 seconds (ref 11.4–15.2)

## 2022-06-11 LAB — HEMOGLOBIN A1C
Hgb A1c MFr Bld: 5.5 % (ref 4.8–5.6)
Mean Plasma Glucose: 111.15 mg/dL

## 2022-06-11 LAB — CBG MONITORING, ED: Glucose-Capillary: 91 mg/dL (ref 70–99)

## 2022-06-11 MED ORDER — ASPIRIN 81 MG PO CHEW
324.0000 mg | CHEWABLE_TABLET | Freq: Once | ORAL | Status: AC
  Administered 2022-06-11: 324 mg via ORAL
  Filled 2022-06-11: qty 4

## 2022-06-11 MED ORDER — ASPIRIN 81 MG PO TBEC
81.0000 mg | DELAYED_RELEASE_TABLET | Freq: Every day | ORAL | Status: DC
  Administered 2022-06-12: 81 mg via ORAL
  Filled 2022-06-11: qty 1

## 2022-06-11 MED ORDER — IOHEXOL 350 MG/ML SOLN
75.0000 mL | Freq: Once | INTRAVENOUS | Status: AC | PRN
  Administered 2022-06-11: 75 mL via INTRAVENOUS

## 2022-06-11 MED ORDER — CLOPIDOGREL BISULFATE 75 MG PO TABS
75.0000 mg | ORAL_TABLET | Freq: Every day | ORAL | Status: DC
  Administered 2022-06-11 – 2022-06-12 (×2): 75 mg via ORAL
  Filled 2022-06-11 (×2): qty 1

## 2022-06-11 MED ORDER — ATORVASTATIN CALCIUM 20 MG PO TABS
40.0000 mg | ORAL_TABLET | Freq: Every evening | ORAL | Status: DC
  Administered 2022-06-11: 40 mg via ORAL
  Filled 2022-06-11: qty 2

## 2022-06-11 MED ORDER — STROKE: EARLY STAGES OF RECOVERY BOOK: Freq: Once | Status: AC

## 2022-06-11 MED ORDER — LABETALOL HCL 5 MG/ML IV SOLN
10.0000 mg | INTRAVENOUS | Status: DC | PRN
  Administered 2022-06-11: 10 mg via INTRAVENOUS
  Filled 2022-06-11: qty 4

## 2022-06-11 MED ORDER — SODIUM CHLORIDE 0.9% FLUSH: 3.0000 mL | Freq: Once | INTRAVENOUS | Status: DC

## 2022-06-11 NOTE — Assessment & Plan Note (Signed)
Reviewed lipid profile from March 2023 where LDL was 153.  Repeat cholesterol profile tomorrow morning and start Lipitor.

## 2022-06-11 NOTE — Telephone Encounter (Signed)
-----   Message from [facility] sent at 06/11/2022 10:22 AM EST ----- Regarding: post op issue Contact: [phone] L4-S1 TLIF on 03/27/22 Around 7:30pm last night he started to feel his right side from his shoulder down to his leg go numb and tingling, like his whole side was asleep. He did not injury himself in any way. He was walking back from the bathroom to the sofa. His wife and daughter took his vitals and told him it was not a stroke. This morning the numbness as eased up some but not all the way. His face feels numb from his nose to his ear too. He has some weakness in his right arm, he is not able to lift it over his head. Should he be concerned? Maybe a nerve issue? He has an appt on 2/27 with you should he try to be seen this week?

## 2022-06-11 NOTE — Assessment & Plan Note (Signed)
BMI 27.96 with current height and weight in computer.

## 2022-06-11 NOTE — ED Notes (Signed)
Patient to MRI at this time.

## 2022-06-11 NOTE — ED Provider Notes (Signed)
[facility] Provider Note    Event Date/Time   First MD Initiated Contact with Patient 06/11/22 1458     (approximate)   History   Weakness and Aphasia   HPI  Erik Wallace is a 57 y.o. male  who comes in with symptoms at at 31PM on 2/19 he has right sided weakness as well as difficulty speaking. It has been constant not getting better.  Did have back surgery in Dec but that was on lumbar area.  No fever, Chest pain, SOB.  Patient reports that he has to try to focus on the words that he needs to say.  He reports that since his spinal surgery in December that he has had no residual weakness or numbness.  He previously had some pain going down his bilateral legs before the surgery but that seems to all be corrected since surgery.  Patient denies any headaches.  He reports that he has a history of hypertension but I thought it was just related to the pain that he was going to be started on medications if his blood pressure was getting better after correction of his spine surgery but at this time he is still not on any medications.      Physical Exam   Triage Vital Signs: ED Triage Vitals  Enc Vitals Group     BP 06/11/22 1255 (!) 198/133     Pulse Rate 06/11/22 1255 96     Resp 06/11/22 1255 17     Temp 06/11/22 1255 97.8 F (36.6 C)     Temp Source 06/11/22 1255 Oral     SpO2 06/11/22 1255 99 %     Weight --      Height --      Head Circumference --      Peak Flow --      Pain Score 06/11/22 1256 0     Pain Loc --      Pain Edu? --      Excl. in [place]? --     Most recent vital signs: Vitals:   06/11/22 1400 06/11/22 1415  BP: (!) 166/100 (!) 165/102  Pulse: 76 91  Resp: 15 17  Temp:    SpO2: 96% 96%     General: Awake, no distress.  CV:  Good peripheral perfusion.  Resp:  Normal effort.  Abd:  No distention.  Soft and nontender Other:  + pronator drift on R slight weakness on the right leg.  Sensation different on the right  side.   ED Results / Procedures / Treatments   Labs (all labs ordered are listed, but only abnormal results are displayed) Labs Reviewed  COMPREHENSIVE METABOLIC PANEL - Abnormal; Notable for the following components:      Result Value   Glucose, Bld 118 (*)    All other components within normal limits  PROTIME-INR  APTT  CBC  DIFFERENTIAL  ETHANOL  CBG MONITORING, ED     EKG  My interpretation of EKG:  Normal sinus rate of 83 without any ST elevations, T wave inversions in the inferior lateral leads.  The T wave inversions in the lateral leads seems similar may be new inferior leads  RADIOLOGY I have reviewed the CT had personally interpreted no evidence of intracranial hemorrhage  PROCEDURES:  Critical Care performed: No  .1-3 Lead EKG Interpretation  Performed by: Vanessa [place], MD Authorized by: Vanessa [place], MD     Interpretation: normal  ECG rate:  80   ECG rate assessment: normal     Rhythm: sinus rhythm     Ectopy: none     Conduction: normal      MEDICATIONS ORDERED IN ED: Medications  sodium chloride flush (NS) 0.9 % injection 3 mL (has no administration in time range)     IMPRESSION / MDM / ASSESSMENT AND PLAN / ED COURSE  I reviewed the triage vital signs and the nursing notes.   Patient's presentation is most consistent with acute presentation with potential threat to life or bodily function.   Differential: stroke, neuropathy, electrolyte abnormalities. Not stroke code because >4.5 hours.  Does not seem to be an LVO but will get CTA given patient is within the 24-hour window.  I did let Dr. Rory Percy know about pt and agress with CTA/MRI.  Given patient's prior back issues I did get an MRI cervical just to ensure that there was not any cervical issue as well given the speech issue does not seem to be very prominent.  Glucose normal Coags normal CBC normal  CMP normal  ETOH normal  CT head negative  CTA negative for LVO  MRI positive  for stroke I did let Dr. Rory Percy know about patient he will see them tomorrow.  Will admit to the hospital team   The patient is on the cardiac monitor to evaluate for evidence of arrhythmia and/or significant heart rate changes.  FINAL CLINICAL IMPRESSION(S) / ED DIAGNOSES   Final diagnoses:  Cerebrovascular accident (CVA), unspecified mechanism ([place])     Rx / DC Orders   ED Discharge Orders     None        Note:  This document was prepared using Dragon voice recognition software and may include unintentional dictation errors.   Vanessa [place], MD 06/11/22 [id number]

## 2022-06-11 NOTE — Assessment & Plan Note (Signed)
Add on a hemoglobin A1c.

## 2022-06-11 NOTE — Telephone Encounter (Signed)
Discussed with Dr Izora Ribas, Marzetta Board, and Andee Poles. All were in agreement that he should proceed to the ER to be evaluated for a stroke. I spoke with Erik Wallace and informed him of this. He agreed to proceed to the ER for evaluation.

## 2022-06-11 NOTE — ED Triage Notes (Signed)
Pt presents to ED with /co of R sided weakness from his head to his foot. Pt also states asphasia and states this all started last night at 2230. Pt states his speech is getting bad but pts speech is clear at this time  Pt states surgery 03/28/23 and it was back surgery. Pt denies blood thinner use. Pt denies injury or trauma to head. Pt is A&Ox4.

## 2022-06-11 NOTE — Assessment & Plan Note (Signed)
With acute stroke will allow permissive hypertension today.  Likely will have to get more aggressive with blood pressure tomorrow.  As needed IV labetalol.

## 2022-06-11 NOTE — Assessment & Plan Note (Signed)
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  Patient with right-sided weakness and incoordination.  Patient also had issues with slurred speech.  Will monitor on telemetry and obtain an echocardiogram bubble study.  Give aspirin 324 mg chewable now then aspirin 81 mg daily after that.  Give Plavix 75 mg daily.  Neurology consultation in the a.m., speech, OT and physical therapy consultations.

## 2022-06-11 NOTE — H&P (Signed)
History and Physical    Patient: Erik Wallace [id number] DOB: 1966-01-08 DOA: 06/11/2022 DOS: the patient was seen and examined on 06/11/2022 PCP: Pcp, No  Patient coming from: Home  Chief Complaint:  Chief Complaint  Patient presents with   Weakness   Aphasia   HPI: Erik Wallace is a 57 y.o. male with medical history significant of hypertension not on any medications, recent lumbar back surgery.  Last night at 7:30 PM he developed right hand, arm weakness and numbness.  It went down his right side down his right leg and into his face.  He stated he got a good night sleep and tried to go to work.  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  He called [name] his neurosurgeon who advised him to come to the hospital as soon as possible to rule out stroke.  In the ER he had an MRI of the brain which showed a small acute left thalamic capsular infarct.  Hospitalist services contacted for further evaluation. Review of Systems: Review of Systems  Constitutional:  Negative for chills, diaphoresis and fever.  HENT:  Positive for tinnitus. Negative for congestion and sore throat.   Eyes:  Negative for blurred vision.  Respiratory:  Negative for cough and shortness of breath.   Cardiovascular:  Negative for chest pain.  Gastrointestinal:  Negative for abdominal pain, blood in stool, constipation, diarrhea, nausea and vomiting.  Genitourinary:  Negative for dysuria.  Musculoskeletal:  Positive for back pain and myalgias.  Skin:  Negative for rash.  Neurological:  Positive for tingling, sensory change, focal weakness and weakness. Negative for seizures.  Endo/Heme/Allergies:  Does not bruise/bleed easily.  Psychiatric/Behavioral:  Positive for depression. The patient is nervous/anxious.     Past Medical History:  Diagnosis Date   Back pain    Elevated blood pressure reading in office without diagnosis of hypertension     due to back pain   Lumbar radiculopathy    Spinal stenosis at L4-L5 level    Past Surgical History:  Procedure Laterality Date   APPLICATION OF INTRAOPERATIVE CT SCAN N/A 03/27/2022   Procedure: APPLICATION OF INTRAOPERATIVE CT SCAN;  Surgeon: Meade Maw, MD;  Location: [facility] ORS;  Service: Neurosurgery;  Laterality: N/A;   HERNIA REPAIR Left    inguinal hernia   L4-5 decompressio     Left L5-S1 discectomy     LUMBAR LAMINECTOMY/DECOMPRESSION MICRODISCECTOMY N/A 07/18/2021   Procedure: L4-5 POSTERIOR SPINAL DECOMPRESSION, LEFT L5-S1  MICRODISCECTOMY;  Surgeon: Meade Maw, MD;  Location: [facility] ORS;  Service: Neurosurgery;  Laterality: N/A;   TRANSFORAMINAL LUMBAR INTERBODY FUSION W/ MIS 2 LEVEL N/A 03/27/2022   Procedure: L4-S1 MINIMALLY INVASIVE (MIS) TRANSFORAMINAL LUMBAR INTERBODY FUSION (TLIF);  Surgeon: Meade Maw, MD;  Location: [facility] ORS;  Service: Neurosurgery;  Laterality: N/A;   Social History:  reports that he has never smoked. He has never used smokeless tobacco. He reports current alcohol use of about 3.0 standard drinks of alcohol per week. He reports that he does not use drugs.  No Known Allergies  Family History  Problem Relation Age of Onset   Heart disease Mother     Prior to Admission medications   Not on File  Patient takes Tylenol only.  Physical Exam: Vitals:   06/11/22 1400 06/11/22 1415 06/11/22 1430 06/11/22 1500  BP: (!) 166/100 (!) 165/102 (!) 167/103 (!) 175/108  Pulse: 76 91 81 80  Resp: 15 17 17 15  $ Temp:      TempSrc:      SpO2: 96% 96% 99% 98%   Physical Exam HENT:     Head: Normocephalic.     Mouth/Throat:     Pharynx: No oropharyngeal exudate.  Eyes:     General: Lids are normal.     Conjunctiva/sclera: Conjunctivae normal.  Cardiovascular:     Rate and Rhythm: Normal rate and regular rhythm.     Heart sounds: Normal heart sounds, S1 normal and S2 normal.  Pulmonary:     Breath sounds: No decreased breath  sounds, wheezing, rhonchi or rales.  Abdominal:     Palpations: Abdomen is soft.     Tenderness: There is no abdominal tenderness.  Musculoskeletal:     Right lower leg: No swelling.     Left lower leg: No swelling.  Skin:    General: Skin is warm.     Findings: No rash.  Neurological:     Mental Status: He is alert and oriented to person, place, and time.     Comments: Power 4 out of 5 right side arm and leg.  5 out of 5 left side.  Coordination with rapid finger movements on the left side slower.  Babinski positive     Data Reviewed: EKG interpreted by me shows normal sinus rhythm 88 bpm, left atrial lodgment, LVH, flipped T waves in V5 and V6 MRI brain and cervical spine showed small acute left thalamic capsular infarct and multi level degenerative changes without stenosis CT angiogram showed no intracranial large vessel occlusion or proximal stenosis CT scan of the head negative White blood cell count 6.7, hemoglobin 15.8, platelet count 283, creatinine 0.86, sodium 138, potassium 4.1, CO2 25, glucose 118, LFTs normal range  Assessment and Plan: * Acute stroke due to ischemia (HCC) Small acute left thalamocapsular infarct seen on MRI.  Patient with right-sided weakness and incoordination.  Patient also had issues with slurred speech.  Will monitor on telemetry and obtain an echocardiogram bubble study.  Give aspirin 324 mg chewable now then aspirin 81 mg daily after that.  Give Plavix 75 mg daily.  Neurology consultation in the a.m., speech, OT and physical therapy consultations.  Accelerated hypertension With acute stroke will allow permissive hypertension today.  Likely will have to get more aggressive with blood pressure tomorrow.  As needed IV labetalol.  Hyperlipidemia, unspecified Reviewed lipid profile from March 2023 where LDL was 153.  Repeat cholesterol profile tomorrow morning and start Lipitor.  Impaired fasting glucose Add on a hemoglobin A1c.  Overweight (BMI  25.0-29.9) BMI 27.96 with current height and weight in computer.      Advance Care Planning:   Code Status: Full Code   Consults: Neurology  Family Communication: Declined  Severity of Illness: The appropriate patient status for this patient is OBSERVATION. Observation status is judged to be reasonable and necessary in order to provide the required intensity of service to ensure the patient's safety. The patient's presenting symptoms, physical exam findings, and initial radiographic and laboratory data in the context of their medical condition is felt to place them at decreased risk for further clinical deterioration. Furthermore, it is anticipated that the patient will be medically stable for discharge from the hospital within 2 midnights of admission.   Author: Loletha Grayer, MD 06/11/2022 5:56 PM  For on call review [email or web address].

## 2022-06-11 NOTE — Progress Notes (Signed)
Patient arrived to unit in stable condition. He walks with a slow, but steady gait. Still complains of numbness/weakness to R-side, especially right arm. Occasional slurring of speech noted.  Patient served in [employer] and currently works in Public relations account executive.

## 2022-06-12 ENCOUNTER — Observation Stay (HOSPITAL_BASED_OUTPATIENT_CLINIC_OR_DEPARTMENT_OTHER)
Admit: 2022-06-12 | Discharge: 2022-06-12 | Disposition: A | Discharge status: Home / Self Care | Payer: Commercial Managed Care - PPO | Attending: Internal Medicine | Admitting: Internal Medicine

## 2022-06-12 DIAGNOSIS — I6389 Other cerebral infarction: Secondary | ICD-10-CM

## 2022-06-12 DIAGNOSIS — I10 Essential (primary) hypertension: Secondary | ICD-10-CM | POA: Diagnosis not present

## 2022-06-12 DIAGNOSIS — R7301 Impaired fasting glucose: Secondary | ICD-10-CM | POA: Diagnosis not present

## 2022-06-12 DIAGNOSIS — I639 Cerebral infarction, unspecified: Secondary | ICD-10-CM | POA: Diagnosis not present

## 2022-06-12 DIAGNOSIS — E785 Hyperlipidemia, unspecified: Secondary | ICD-10-CM | POA: Diagnosis not present

## 2022-06-12 LAB — BASIC METABOLIC PANEL
Anion gap: 9 (ref 5–15)
BUN: 17 mg/dL (ref 6–20)
CO2: 26 mmol/L (ref 22–32)
Calcium: 9.2 mg/dL (ref 8.9–10.3)
Chloride: 104 mmol/L (ref 98–111)
Creatinine, Ser: 0.85 mg/dL (ref 0.61–1.24)
GFR, Estimated: >60 mL/min (ref >60)
Glucose, Bld: 124 mg/dL — ABNORMAL HIGH (ref 70–99)
Potassium: 3.8 mmol/L (ref 3.5–5.1)
Sodium: 139 mmol/L (ref 135–145)

## 2022-06-12 LAB — CBC
HCT: 44.3 % (ref 39.0–52.0)
Hemoglobin: 14.8 g/dL (ref 13.0–17.0)
MCH: 31.2 pg (ref 26.0–34.0)
MCHC: 33.4 g/dL (ref 30.0–36.0)
MCV: 93.5 fL (ref 80.0–100.0)
Platelets: 262 10*3/uL (ref 150–400)
RBC: 4.74 MIL/uL (ref 4.22–5.81)
RDW: 13.8 % (ref 11.5–15.5)
WBC: 6.9 10*3/uL (ref 4.0–10.5)
nRBC: 0 % (ref 0.0–0.2)

## 2022-06-12 LAB — ECHOCARDIOGRAM COMPLETE BUBBLE STUDY
AR max vel: 3.76 cm2
AV Area VTI: 4.03 cm2
AV Area mean vel: 3.42 cm2
AV Mean grad: 2 mmHg
AV Peak grad: 2.8 mmHg
Ao pk vel: 0.83 m/s
Area-P 1/2: 3.87 cm2
S' Lateral: 2.5 cm

## 2022-06-12 LAB — LIPID PANEL
Cholesterol: 245 mg/dL — ABNORMAL HIGH (ref 0–200)
HDL: 38 mg/dL — ABNORMAL LOW (ref >40)
LDL Cholesterol: 141 mg/dL — ABNORMAL HIGH (ref 0–99)
Total CHOL/HDL Ratio: 6.4 RATIO
Triglycerides: 332 mg/dL — ABNORMAL HIGH (ref <150)
VLDL: 66 mg/dL — ABNORMAL HIGH (ref 0–40)

## 2022-06-12 MED ORDER — ATORVASTATIN CALCIUM 80 MG PO TABS: 80.0000 mg | ORAL_TABLET | Freq: Every evening | ORAL | 1 refills | Status: DC

## 2022-06-12 MED ORDER — CLOPIDOGREL BISULFATE 75 MG PO TABS: 75.0000 mg | ORAL_TABLET | Freq: Every day | ORAL | 0 refills | Status: AC

## 2022-06-12 MED ORDER — ATORVASTATIN CALCIUM 20 MG PO TABS: 80.0000 mg | ORAL_TABLET | Freq: Every evening | ORAL | Status: DC

## 2022-06-12 MED ORDER — ASPIRIN 81 MG PO TBEC: 81.0000 mg | DELAYED_RELEASE_TABLET | Freq: Every day | ORAL | 12 refills | Status: AC

## 2022-06-12 MED ORDER — LOSARTAN POTASSIUM 25 MG PO TABS: 25.0000 mg | ORAL_TABLET | Freq: Every day | ORAL | 1 refills | Status: DC

## 2022-06-12 NOTE — Progress Notes (Signed)
*  PRELIMINARY RESULTS* Echocardiogram 2D Echocardiogram has been performed.  Erik Wallace 06/12/2022, 8:08 AM

## 2022-06-12 NOTE — Consult Note (Addendum)
Neurology Consultation  Reason for Consult: Stroke Referring Physician: Dr. Marjean Donna  CC: Right-sided weakness and numbness  History is obtained from: Patient, chart  HPI: Erik Wallace is a 57 y.o. male veteran of the [place], past medical history of lumbar radiculopathy and spinal stenosis status post decompression last year, chronic back pain, elevated blood pressure on no medications at baseline, presenting for evaluation of right-sided numbness and weakness with last known well sometime around Monday,06/10/2022 in the evening.  He noticed that his right hand and right arm became numb and then his right chest wall, right leg and right face developed numbness.  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  When symptoms did not improve, he came into the ER for further evaluation.  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 Outside the window for intervention Has had elevated blood pressures without treatment.  Was attributed to pain from the back issues and has not seen a primary care in a while. Stroke workup is underway.   LKW: Sometime in the evening of 06/10/2022 IV thrombolysis given?: no, outside the window EVT: No-outside the window Premorbid modified Rankin scale (mRS): 0   ROS: Full ROS was performed and is negative except as noted in the HPI.   Past Medical History:  Diagnosis Date   Back pain    Elevated blood pressure reading in office without diagnosis of hypertension    due to back pain   Lumbar radiculopathy    Spinal stenosis at L4-L5 level     Family History  Problem Relation Age of Onset   Heart disease Mother     Social History:   reports that he has never smoked. He has never used smokeless tobacco. He reports current alcohol use of about 3.0 standard drinks of alcohol per week. He reports that he does not use drugs.  Medications  Current  Facility-Administered Medications:     stroke: early stages of recovery book, , Does not apply, Once, Wallace, Richard, MD   aspirin EC tablet 81 mg, 81 mg, Oral, Daily, Wallace, Richard, MD   atorvastatin (LIPITOR) tablet 40 mg, 40 mg, Oral, QPM, Wallace, Richard, MD, 40 mg at 06/11/22 1846   clopidogrel (PLAVIX) tablet 75 mg, 75 mg, Oral, Daily, Erik Wallace, Richard, MD, 75 mg at 06/11/22 1847   labetalol (NORMODYNE) injection 10 mg, 10 mg, Intravenous, Q2H PRN, Loletha Grayer, MD, 10 mg at 06/11/22 1851   sodium chloride flush (NS) 0.9 % injection 3 mL, 3 mL, Intravenous, Once, Duffy Bruce, MD   Exam: Current vital signs: BP (!) 155/100 (BP Location: Right Arm) Comment: notified RN Erik Wallace-NT+3 LH  Pulse 65   Temp 98 F (36.7 C) (Oral)   Resp 18   Ht 5' 10"$  (1.778 m)   SpO2 98%   BMI 27.95 kg/m  Vital signs in last 24 hours: Temp:  [97.7 F (36.5 C)-98 F (36.7 C)] 98 F (36.7 C) (02/21 0800) Pulse Rate:  [64-96] 65 (02/21 0800) Resp:  [15-20] 18 (02/21 0800) BP: (133-198)/(87-133) 155/100 (02/21 0800) SpO2:  [94 %-100 %] 98 % (02/21 0800) General: Awake alert in no distress HEENT: Normocephalic atraumatic Lungs: Clear Cardiovascular: Regular rate rhythm Neurological exam Awake alert oriented x 3 No dysarthria No aphasia Cranial nerves II to XII intact Motor examination with no drift in any of the 4 extremities Sensation: Intact to light touch Coordination  with mild dysmetria in the right upper extremity.  Otherwise intact. Gait testing deferred NIH stroke scale-1  Labs I have reviewed labs in epic and the results pertinent to this consultation are:  CBC    Component Value Date/Time   WBC 6.9 06/12/2022 0338   RBC 4.74 06/12/2022 0338   HGB 14.8 06/12/2022 0338   HCT 44.3 06/12/2022 0338   PLT 262 06/12/2022 0338   MCV 93.5 06/12/2022 0338   MCH 31.2 06/12/2022 0338   MCHC 33.4 06/12/2022 0338   RDW 13.8 06/12/2022 0338   LYMPHSABS 2.5 06/11/2022 1257    MONOABS 0.6 06/11/2022 1257   EOSABS 0.1 06/11/2022 1257   BASOSABS 0.0 06/11/2022 1257    CMP     Component Value Date/Time   NA 139 06/12/2022 0338   K 3.8 06/12/2022 0338   CL 104 06/12/2022 0338   CO2 26 06/12/2022 0338   GLUCOSE 124 (H) 06/12/2022 0338   BUN 17 06/12/2022 0338   CREATININE 0.85 06/12/2022 0338   CALCIUM 9.2 06/12/2022 0338   PROT 7.8 06/11/2022 1257   ALBUMIN 4.6 06/11/2022 1257   AST 22 06/11/2022 1257   ALT 23 06/11/2022 1257   ALKPHOS 51 06/11/2022 1257   BILITOT 1.1 06/11/2022 1257   GFRNONAA >60 06/12/2022 0338  Hemoglobin A1c-5.5  Lipid Panel     Component Value Date/Time   CHOL 245 (H) 06/12/2022 0338   TRIG 332 (H) 06/12/2022 0338   HDL 38 (L) 06/12/2022 0338   CHOLHDL 6.4 06/12/2022 0338   VLDL 66 (H) 06/12/2022 0338   LDLCALC 141 (H) 06/12/2022 0338     Imaging I have reviewed the images obtained:  MR brain with a acute left thalamic capsular infarct. CT angio head and neck with no emergent   Assessment: 57 year old man with untreated hypertension, presenting for evaluation of sudden onset of right-sided numbness and weakness ongoing for the past few days outside the window for IV thrombolysis, symptoms consistent with a lacunar infarct and MRI of the brain revealing an acute left thalamic capsular infarct-likely secondary to uncontrolled hypertension and hyperlipidemia. LDL 141. 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 I would only recommend  permissive hypertension for another day or so and then start normalizing her blood pressure with blood pressure goal outpatient of 140/90 or below at all times. I discussed this in great detail with him and his wife at bedside. Plan was relayed to Dr. Reesa Chew via secure chat  Addendum 2D echocardiogram completed. No cardiac source of stroke. Recommendations as above.     -- Erik Portland, MD Neurologist [facility] Pager: [phone]

## 2022-06-12 NOTE — Plan of Care (Signed)
  Problem: Education: Goal: Knowledge of disease or condition will improve Outcome: Progressing   Problem: Coping: Goal: Will verbalize positive feelings about self Outcome: Progressing   Problem: Health Behavior/Discharge Planning: Goal: Ability to manage health-related needs will improve Outcome: Progressing   Problem: Nutrition: Goal: Risk of aspiration will decrease Outcome: Progressing   Problem: Education: Goal: Knowledge of General Education information will improve Description: Including pain rating scale, medication(s)/side effects and non-pharmacologic comfort measures Outcome: Progressing   Problem: Pain Managment: Goal: General experience of comfort will improve Outcome: Progressing   Problem: Safety: Goal: Ability to remain free from injury will improve Outcome: Progressing

## 2022-06-12 NOTE — Discharge Summary (Signed)
Physician Discharge Summary   Patient: Erik Wallace MRN: [id number] DOB: Jun 03, 1965  Admit date:     06/11/2022  Discharge date: 06/12/22  Discharge Physician: Lorella Nimrod   PCP: Pcp, No   Recommendations at discharge:  Please obtain CBC and BMP in 1 week Patient will need DAPT with aspirin and Plavix for 3 weeks followed by aspirin only. He is also been started on low-dose losartan and need titration for better control of hypertension. Follow-up with primary care provider within the week Follow-up with neurology in 1 month  Discharge Diagnoses: Principal Problem:   Acute stroke due to ischemia [facility]) Active Problems:   Accelerated hypertension   Hyperlipidemia, unspecified   Impaired fasting glucose   Overweight (BMI 25.0-29.9)   Hospital Course: Erik Wallace is a 57 y.o. male with medical history significant of hypertension not on any medications, recent lumbar back surgery.  Last night at 7:30 PM he developed right hand, arm weakness and numbness.  It went down his right side down his right leg and into his face.  He stated he got a good night sleep and tried to go to work.  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  He called [name] his neurosurgeon who advised him to come to the hospital as soon as possible to rule out stroke.  In the ER he had an MRI of the brain which showed a small acute left thalamic capsular infarct.   2.21: Vitals normal.  Lipid panel with elevated total cholesterol is 245, triglyceride 332, HDL 38 and LDL of 141 with goal should be less than 70, A1c 5.5, rest of the labs unremarkable MRI of cervical spine with mild multilevel degenerative change without significant stenosis. CTA head and neck with no large vessel occlusion or proximal stenosis, aortic atherosclerosis noted.  Did had minimal atherosclerotic changes at the right carotid bifurcation, no stenosis. Echocardiogram  with normal EF, grade 1 diastolic dysfunction, mildly dilated aortic root and negative bubble studies.  Neurology evaluated him and they were recommending DAPT with aspirin and Plavix for 3 weeks followed by aspirin only.  He was also started on high intensity statin with a goal of LDL less than 70.  PT is recommending outpatient therapy.  Most of his symptoms has been resolved. Blood pressure elevated.  Most likely has small vessel disease secondary to uncontrolled hypertension.  Patient was not on any blood pressure medications at home.  He was given a prescription of losartan at 25 mg daily to be started from tomorrow, so he can complete 48 hours of permissive hypertension.  Patient need to have a close follow-up with his PCP for further management and titration of antihypertensives.  Patient also need to follow-up with neurology in 1 month.  He will continue on current medications and follow-up with his providers.  Assessment and Plan: * Acute stroke due to ischemia [facility]) Small acute left thalamocapsular infarct seen on MRI.  Patient with right-sided weakness and incoordination.  Patient also had issues with slurred speech.  Will monitor on telemetry and obtain an echocardiogram bubble study.  Give aspirin 324 mg chewable now then aspirin 81 mg daily after that.  Give Plavix 75 mg daily.  Neurology consultation in the a.m., speech, OT and physical therapy consultations.  Accelerated hypertension With acute stroke will allow permissive hypertension today.  Likely will have to get more aggressive with blood pressure tomorrow.  As  needed IV labetalol.  Hyperlipidemia, unspecified Reviewed lipid profile from March 2023 where LDL was 153.  Repeat cholesterol profile tomorrow morning and start Lipitor.  Impaired fasting glucose Add on a hemoglobin A1c.  Overweight (BMI 25.0-29.9) BMI 27.96 with current height and weight in computer.   Consultants: Neurology Procedures performed:  None Disposition: Home Diet recommendation:  Discharge Diet Orders (From admission, onward)     Start     Ordered   06/12/22 0000  Diet - low sodium heart healthy        06/12/22 1351           Cardiac diet DISCHARGE MEDICATION: Allergies as of 06/12/2022   No Known Allergies      Medication List     TAKE these medications    aspirin EC 81 MG tablet Take 1 tablet (81 mg total) by mouth daily. Swallow whole. Start taking on: June 13, 2022   atorvastatin 80 MG tablet Commonly known as: LIPITOR Take 1 tablet (80 mg total) by mouth every evening.   clopidogrel 75 MG tablet Commonly known as: PLAVIX Take 1 tablet (75 mg total) by mouth daily for 21 days. Start taking on: June 13, 2022   losartan 25 MG tablet Commonly known as: COZAAR Take 1 tablet (25 mg total) by mouth daily.        Follow-up Information     Vladimir Crofts, MD. Schedule an appointment as soon as possible for a visit in 1 month(s).   Specialty: Neurology Why: Patient to make own follow up appt Contact information: [facility] [place] [id number] [phone]                Discharge Exam: There were no vitals filed for this visit. General.     In no acute distress. Pulmonary.  Lungs clear bilaterally, normal respiratory effort. CV.  Regular rate and rhythm, no JVD, rub or murmur. Abdomen.  Soft, nontender, nondistended, BS positive. CNS.  Alert and oriented .  No focal neurologic deficit. Extremities.  No edema, no cyanosis, pulses intact and symmetrical. Psychiatry.  Judgment and insight appears normal.   Condition at discharge: stable  The results of significant diagnostics from this hospitalization (including imaging, microbiology, ancillary and laboratory) are listed below for reference.   Imaging Studies: ECHOCARDIOGRAM COMPLETE BUBBLE STUDY  Result Date: 06/12/2022    ECHOCARDIOGRAM REPORT   Patient Name:   Erik Wallace Date of Exam: 06/12/2022 Medical Rec #:  [id number]        Height:       70.0 in Accession #:    [id number]       Weight:       194.8 lb Date of Birth:  [birth year]        BSA:          2.064 m Patient Age:    56 years         BP:           133/87 mmHg Patient Gender: M                HR:           70 bpm. Exam Location:  [facility] Procedure: 2D Echo, Cardiac Doppler, Color Doppler and Saline Contrast Bubble            Study Indications:     Stroke 434.91 / I63.9  History:         Patient has no prior  history of Echocardiogram examinations.                  Risk Factors:Hypertension and Dyslipidemia.  Sonographer:     Sherrie Sport Referring Phys:  [id number] Loletha Grayer Diagnosing Phys: Kate Sable MD  Sonographer Comments: Image quality was good. IMPRESSIONS  1. Left ventricular ejection fraction, by estimation, is 60 to 65%. The left ventricle has normal function. The left ventricle has no regional wall motion abnormalities. There is mild left ventricular hypertrophy. Left ventricular diastolic parameters are consistent with Grade I diastolic dysfunction (impaired relaxation).  2. Right ventricular systolic function is normal. The right ventricular size is normal.  3. The mitral valve is normal in structure. No evidence of mitral valve regurgitation.  4. The aortic valve is tricuspid. Aortic valve regurgitation is not visualized.  5. Aortic dilatation noted. There is mild dilatation of the aortic root, measuring 40 mm.  6. The inferior vena cava is normal in size with greater than 50% respiratory variability, suggesting right atrial pressure of 3 mmHg.  7. Agitated saline contrast bubble study was negative, with no evidence of any interatrial shunt. FINDINGS  Left Ventricle: Left ventricular ejection fraction, by estimation, is 60 to 65%. The left ventricle has normal function. The left ventricle has no regional wall motion abnormalities. The left ventricular internal cavity size was normal in size. There is   mild left ventricular hypertrophy. Left ventricular diastolic parameters are consistent with Grade I diastolic dysfunction (impaired relaxation). Right Ventricle: The right ventricular size is normal. No increase in right ventricular wall thickness. Right ventricular systolic function is normal. Left Atrium: Left atrial size was normal in size. Right Atrium: Right atrial size was normal in size. Pericardium: There is no evidence of pericardial effusion. Mitral Valve: The mitral valve is normal in structure. No evidence of mitral valve regurgitation. Tricuspid Valve: The tricuspid valve is normal in structure. Tricuspid valve regurgitation is not demonstrated. Aortic Valve: The aortic valve is tricuspid. Aortic valve regurgitation is not visualized. Aortic valve mean gradient measures 2.0 mmHg. Aortic valve peak gradient measures 2.8 mmHg. Aortic valve area, by VTI measures 4.03 cm. Pulmonic Valve: The pulmonic valve was normal in structure. Pulmonic valve regurgitation is not visualized. Aorta: Aortic dilatation noted. There is mild dilatation of the aortic root, measuring 40 mm. Venous: The inferior vena cava is normal in size with greater than 50% respiratory variability, suggesting right atrial pressure of 3 mmHg. IAS/Shunts: No atrial level shunt detected by color flow Doppler. Agitated saline contrast was given intravenously to evaluate for intracardiac shunting. Agitated saline contrast bubble study was negative, with no evidence of any interatrial shunt.  LEFT VENTRICLE PLAX 2D LVIDd:         4.20 cm   Diastology LVIDs:         2.50 cm   LV e' medial:    5.00 cm/s LV PW:         1.30 cm   LV E/e' medial:  10.6 LV IVS:        1.30 cm   LV e' lateral:   10.10 cm/s LVOT diam:     2.20 cm   LV E/e' lateral: 5.2 LV SV:         59 LV SV Index:   29 LVOT Area:     3.80 cm  RIGHT VENTRICLE RV Basal diam:  1.90 cm RV Mid diam:    2.00 cm RV S prime:     11.60 cm/s TAPSE (M-mode):  1.6 cm LEFT ATRIUM             Index         RIGHT ATRIUM          Index LA diam:        3.70 cm 1.79 cm/m   RA Area:     6.91 cm LA Vol (A2C):   46.5 ml 22.53 ml/m  RA Volume:   9.40 ml  4.55 ml/m LA Vol (A4C):   37.7 ml 18.26 ml/m LA Biplane Vol: 41.9 ml 20.30 ml/m  AORTIC VALVE AV Area (Vmax):    3.76 cm AV Area (Vmean):   3.42 cm AV Area (VTI):     4.03 cm AV Vmax:           83.10 cm/s AV Vmean:          59.400 cm/s AV VTI:            0.147 m AV Peak Grad:      2.8 mmHg AV Mean Grad:      2.0 mmHg LVOT Vmax:         82.20 cm/s LVOT Vmean:        53.400 cm/s LVOT VTI:          0.156 m LVOT/AV VTI ratio: 1.06  AORTA Ao Root diam: 3.70 cm MITRAL VALVE               TRICUSPID VALVE MV Area (PHT): 3.87 cm    TR Peak grad:   11.8 mmHg MV Decel Time: 196 msec    TR Vmax:        172.00 cm/s MV E velocity: 52.90 cm/s MV A velocity: 71.30 cm/s  SHUNTS MV E/A ratio:  0.74        Systemic VTI:  0.16 m                            Systemic Diam: 2.20 cm Kate Sable MD Electronically signed by Kate Sable MD Signature Date/Time: 06/12/2022/1:12:40 PM    Final    MR BRAIN WO CONTRAST  Result Date: 06/11/2022 CLINICAL DATA:  Headache, neuro deficit; Ataxia, nontraumatic, cervical pathology suspected EXAM: MRI HEAD WITHOUT CONTRAST MRI CERVICAL SPINE WITHOUT CONTRAST TECHNIQUE: Multiplanar, multiecho pulse sequences of the brain and surrounding structures, and cervical spine, to include the craniocervical junction and cervicothoracic junction, were obtained without intravenous contrast. COMPARISON:  CT head February 20, 24. FINDINGS: MRI HEAD FINDINGS Brain: Small acute left thalamocapsular infarct. Mild edema without mass effect. No evidence of acute hemorrhage, mass lesion, midline shift or hydrocephalus. Vascular: Major arterial flow voids are maintained at the skull base. Skull and upper cervical spine: Normal marrow signal. Sinuses/Orbits: Largely clear sinuses.  No acute orbital findings. MRI CERVICAL SPINE FINDINGS Alignment: No  substantial sagittal subluxation. Vertebrae: No fracture, evidence of discitis, or bone lesion. Cord: Normal cord signal. Posterior Fossa, vertebral arteries, paraspinal tissues: Visualized vertebral artery flow voids are maintained. Disc levels: C2-C3: Mild posterior disc osteophyte complex without significant stenosis. C3-C4: Posterior disc osteophyte complex with bilateral facet and uncovertebral hypertrophy, mild. No significant stenosis. C4-C5: Left facet and uncovertebral hypertrophy with mild left foraminal stenosis. Patent canal and right foramen. C5-C6: Mild facet uncovertebral hypertrophy without significant stenosis. C6-C7: No significant disc protrusion, foraminal stenosis, or canal stenosis. C7-T1: No significant disc protrusion, foraminal stenosis, or canal stenosis. IMPRESSION: MRI head: Small acute left thalamocapsular infarct. MRI cervical spine: Mild multilevel degenerative  change without significant stenosis. Electronically Signed   By: Margaretha Sheffield M.D.   On: 06/11/2022 16:43   MR Cervical Spine Wo Contrast  Result Date: 06/11/2022 CLINICAL DATA:  Headache, neuro deficit; Ataxia, nontraumatic, cervical pathology suspected EXAM: MRI HEAD WITHOUT CONTRAST MRI CERVICAL SPINE WITHOUT CONTRAST TECHNIQUE: Multiplanar, multiecho pulse sequences of the brain and surrounding structures, and cervical spine, to include the craniocervical junction and cervicothoracic junction, were obtained without intravenous contrast. COMPARISON:  CT head February 20, 24. FINDINGS: MRI HEAD FINDINGS Brain: Small acute left thalamocapsular infarct. Mild edema without mass effect. No evidence of acute hemorrhage, mass lesion, midline shift or hydrocephalus. Vascular: Major arterial flow voids are maintained at the skull base. Skull and upper cervical spine: Normal marrow signal. Sinuses/Orbits: Largely clear sinuses.  No acute orbital findings. MRI CERVICAL SPINE FINDINGS Alignment: No substantial sagittal  subluxation. Vertebrae: No fracture, evidence of discitis, or bone lesion. Cord: Normal cord signal. Posterior Fossa, vertebral arteries, paraspinal tissues: Visualized vertebral artery flow voids are maintained. Disc levels: C2-C3: Mild posterior disc osteophyte complex without significant stenosis. C3-C4: Posterior disc osteophyte complex with bilateral facet and uncovertebral hypertrophy, mild. No significant stenosis. C4-C5: Left facet and uncovertebral hypertrophy with mild left foraminal stenosis. Patent canal and right foramen. C5-C6: Mild facet uncovertebral hypertrophy without significant stenosis. C6-C7: No significant disc protrusion, foraminal stenosis, or canal stenosis. C7-T1: No significant disc protrusion, foraminal stenosis, or canal stenosis. IMPRESSION: MRI head: Small acute left thalamocapsular infarct. MRI cervical spine: Mild multilevel degenerative change without significant stenosis. Electronically Signed   By: Margaretha Sheffield M.D.   On: 06/11/2022 16:43   CT ANGIO HEAD NECK W WO CM  Result Date: 06/11/2022 CLINICAL DATA:  Neuro deficit, acute, stroke suspected. Right-sided weakness. EXAM: CT ANGIOGRAPHY HEAD AND NECK TECHNIQUE: Multidetector CT imaging of the head and neck was performed using the standard protocol during bolus administration of intravenous contrast. Multiplanar CT image reconstructions and MIPs were obtained to evaluate the vascular anatomy. Carotid stenosis measurements (when applicable) are obtained utilizing NASCET criteria, using the distal internal carotid diameter as the denominator. RADIATION DOSE REDUCTION: This exam was performed according to the departmental dose-optimization program which includes automated exposure control, adjustment of the mA and/or kV according to patient size and/or use of iterative reconstruction technique. CONTRAST:  38m OMNIPAQUE IOHEXOL 350 MG/ML SOLN COMPARISON:  Head CT same day FINDINGS: CTA NECK FINDINGS Aortic arch: Aortic  atherosclerosis. Branching pattern is normal without origin stenosis. Right carotid system: Common carotid artery widely patent to the bifurcation. Mild calcified plaque at the ICA bulb but no stenosis. Cervical ICA widely patent beyond. Left carotid system: Common carotid artery widely patent to the bifurcation. Carotid bifurcation and ICA bulb are normal. More distal cervical ICA is normal. Vertebral arteries: Both vertebral artery origins are widely patent. Both vertebral arteries are normal through the cervical region to the foramen magnum. Skeleton: Mild cervical spondylosis. Other neck: No mass or lymphadenopathy. Upper chest: Lung apices are clear.  Incidental azygous fissure. Review of the MIP images confirms the above findings CTA HEAD FINDINGS Anterior circulation: Both internal carotid arteries are widely patent through the skull base and siphon regions. No siphon stenosis. The anterior and middle cerebral vessels are patent. No large vessel occlusion or proximal stenosis. No aneurysm or vascular malformation. Posterior circulation: Both vertebral arteries widely patent through the foramen magnum to the basilar artery. No basilar stenosis. Posterior circulation branch vessels are normal. Venous sinuses: Patent and normal. Anatomic variants: None significant. Review of  the MIP images confirms the above findings IMPRESSION: 1. No intracranial large vessel occlusion or proximal stenosis. 2. Aortic atherosclerosis. 3. Minimal atherosclerotic change at the right carotid bifurcation. No stenosis. Aortic Atherosclerosis (ICD10-I70.0). Electronically Signed   By: Nelson Chimes M.D.   On: 06/11/2022 15:56   CT HEAD WO CONTRAST  Result Date: 06/11/2022 CLINICAL DATA:  Transient ischemic attack (TIA). EXAM: CT HEAD WITHOUT CONTRAST TECHNIQUE: Contiguous axial images were obtained from the base of the skull through the vertex without intravenous contrast. RADIATION DOSE REDUCTION: This exam was performed according  to the departmental dose-optimization program which includes automated exposure control, adjustment of the mA and/or kV according to patient size and/or use of iterative reconstruction technique. COMPARISON:  None Available. FINDINGS: Brain: No evidence of acute infarction, hemorrhage, hydrocephalus, extra-axial collection or mass lesion/mass effect. Vascular: No hyperdense vessel or unexpected calcification. Skull: Normal. Negative for fracture or focal lesion. Sinuses/Orbits: Mucoperiosteal thickening consistent with chronic right maxillary sinusitis. IMPRESSION: No acute intracranial process. Electronically Signed   By: Sammie Bench M.D.   On: 06/11/2022 14:31    Microbiology: Results for orders placed or performed during the hospital encounter of 03/18/22  Surgical pcr screen     Status: None   Collection Time: 03/18/22 11:44 AM   Specimen: Nasal Mucosa; Nasal Swab  Result Value Ref Range Status   MRSA, PCR NEGATIVE NEGATIVE Final   Staphylococcus aureus NEGATIVE NEGATIVE Final    Comment: (NOTE) The Xpert SA Assay (FDA approved for NASAL specimens in patients 71 years of age and older), is one component of a comprehensive surveillance program. It is not intended to diagnose infection nor to guide or monitor treatment. Performed at [facility], [address] [place], [place] [address]     Labs: CBC: Recent Labs  Lab 06/11/22 1257 06/12/22 0338  WBC 6.7 6.9  NEUTROABS 3.6  --   HGB 15.8 14.8  HCT 47.3 44.3  MCV 93.3 93.5  PLT 283 [id number]   Basic Metabolic Panel: Recent Labs  Lab 06/11/22 1257 06/12/22 0338  NA 138 139  K 4.1 3.8  CL 105 104  CO2 25 26  GLUCOSE 118* 124*  BUN 20 17  CREATININE 0.86 0.85  CALCIUM 10.0 9.2   Liver Function Tests: Recent Labs  Lab 06/11/22 1257  AST 22  ALT 23  ALKPHOS 51  BILITOT 1.1  PROT 7.8  ALBUMIN 4.6   CBG: Recent Labs  Lab 06/11/22 1345  GLUCAP 91    Discharge time spent: greater than 30  minutes.  This record has been created using Systems analyst. Errors have been sought and corrected,but may not always be located. Such creation errors do not reflect on the standard of care.   Signed: Lorella Nimrod, MD [facility] 06/12/2022

## 2022-06-12 NOTE — Hospital Course (Addendum)
Erik Wallace is a 57 y.o. male with medical history significant of hypertension not on any medications, recent lumbar back surgery.  Last night at 7:30 PM he developed right hand, arm weakness and numbness.  It went down his right side down his right leg and into his face.  He stated he got a good night sleep and tried to go to work.  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  He called [name] his neurosurgeon who advised him to come to the hospital as soon as possible to rule out stroke.  In the ER he had an MRI of the brain which showed a small acute left thalamic capsular infarct.   2.21: Vitals normal.  Lipid panel with elevated total cholesterol is 245, triglyceride 332, HDL 38 and LDL of 141 with goal should be less than 70, A1c 5.5, rest of the labs unremarkable MRI of cervical spine with mild multilevel degenerative change without significant stenosis. CTA head and neck with no large vessel occlusion or proximal stenosis, aortic atherosclerosis noted.  Did had minimal atherosclerotic changes at the right carotid bifurcation, no stenosis. Echocardiogram with normal EF, grade 1 diastolic dysfunction, mildly dilated aortic root and negative bubble studies.  Neurology evaluated him and they were recommending DAPT with aspirin and Plavix for 3 weeks followed by aspirin only.  He was also started on high intensity statin with a goal of LDL less than 70.  PT is recommending outpatient therapy.  Most of his symptoms has been resolved. Blood pressure elevated.  Most likely has small vessel disease secondary to uncontrolled hypertension.  Patient was not on any blood pressure medications at home.  He was given a prescription of losartan at 25 mg daily to be started from tomorrow, so he can complete 48 hours of permissive hypertension.  Patient need to have a close follow-up with his PCP for further management and titration of  antihypertensives.  Patient also need to follow-up with neurology in 1 month.  He will continue on current medications and follow-up with his providers.

## 2022-06-12 NOTE — Progress Notes (Signed)
SLP Cancellation Note  Patient Details Name: JERMANY TWENTER MRN: [id number] DOB: 01-29-1966   Cancelled treatment:       Reason Eval/Treat Not Completed: SLP screened, no needs identified, will sign off. Spoke with patient and wife who report pt's communication and cognition are at or near baseline; speech is fluent without dysarthria. No needs identified at this time. SLP to s/o.  Deneise Lever, [place], [place] [employer] [phone]    Aliene Altes 06/12/2022, 11:44 AM

## 2022-06-12 NOTE — Progress Notes (Signed)
PT Cancellation Note  Patient Details Name: Erik Wallace MRN: [id number] DOB: 12/28/1965   Cancelled Treatment:    Reason Eval/Treat Not Completed: Other (comment). Screen performed this date. Pt indep with all mobility. Balance testing including feet together and SLS with eyes open/closed. No numbness in R LE. Still presents with R UE deficits. OT to formally eval. Will dc in house, no acute PT indications.   [name] 06/12/2022, 10:10 AM Greggory Stallion, PT, DPT, [facility] [phone]

## 2022-06-12 NOTE — Evaluation (Addendum)
Occupational Therapy Evaluation Patient Details Name: Erik Wallace MRN: [id number] DOB: 1965/12/30 Today's Date: 06/12/2022   History of Present Illness 57 year old man with untreated hypertension, presenting for evaluation of sudden onset of right-sided numbness and weakness ongoing for the past few days outside the window for IV thrombolysis, symptoms consistent with a lacunar infarct and MRI of the brain revealing an acute left thalamic capsular infarct-likely secondary to uncontrolled hypertension and hyperlipidemia   Clinical Impression   Mr Scovel was seen for OT evaluation this date. Prior to hospital admission, pt was IND. Pt lives with family in home c 8 STE. Pt presents to acute OT demonstrating impaired ADL performance and functional mobility 2/2 decreased activity tolerance, functional coordination deficits, and impaired use of dominant RUE. Pt currently requires MIN A for buttons to complete UBD. IND functional mobility including x4 steps. Increased time to complete standing grooming task. Green theraputty provided and HEP reviewed. [facility] activities reviewed (manipulating coins, shuffling cards, etc). All education complete, will sign off. Upon hospital discharge, recommend Outpatient OT for safe return to work.   Recommendations for follow up therapy are one component of a multi-disciplinary discharge planning process, led by the attending physician.  Recommendations may be updated based on patient status, additional functional criteria and insurance authorization.   Follow Up Recommendations  Outpatient OT     Assistance Recommended at Discharge Set up Supervision/Assistance  Patient can return home with the following Assist for transportation    Functional Status Assessment  Patient has had a recent decline in their functional status and demonstrates the ability to make significant improvements in function in a reasonable and predictable amount of time.  Equipment  Recommendations  None recommended by OT    Recommendations for Other Services       Precautions / Restrictions Precautions Precautions: Back Restrictions Weight Bearing Restrictions: No      Mobility Bed Mobility Overal bed mobility: Independent                  Transfers Overall transfer level: Independent                        Balance Overall balance assessment: Independent                                         ADL either performed or assessed with clinical judgement   ADL Overall ADL's : Needs assistance/impaired                                       General ADL Comments: MIN A for buttons to complete UBD. IND functional mobliity. Increased time to complete standing grooming task      Pertinent Vitals/Pain Pain Assessment Pain Assessment: No/denies pain     Hand Dominance Right   Extremity/Trunk Assessment Upper Extremity Assessment Upper Extremity Assessment: Overall WFL for tasks assessed   Lower Extremity Assessment Lower Extremity Assessment: Overall WFL for tasks assessed       Communication Communication Communication: No difficulties   Cognition Arousal/Alertness: Awake/alert Behavior During Therapy: WFL for tasks assessed/performed Overall Cognitive Status: Within Functional Limits for tasks assessed  Home Living Family/patient expects to be discharged to:: Private residence Living Arrangements: Spouse/significant other Available Help at Discharge: Family;Available 24 hours/day Type of Home: House Home Access: Stairs to enter [employer] of Steps: 8 Entrance Stairs-Rails: Right;Left Home Layout: One level     Bathroom Shower/Tub: Walk-in shower         Home Equipment: Toilet riser;Hand held shower head;Grab bars - tub/shower          Prior Functioning/Environment Prior Level of Function :  Independent/Modified Independent             Mobility Comments: hasn't been working since last year since he hurt his back. Reports no falls.          OT Problem List: Decreased strength;Decreased activity tolerance;Impaired UE functional use         OT Goals(Current goals can be found in the care plan section) Acute Rehab OT Goals Patient Stated Goal: to go home OT Goal Formulation: With patient/family Time For Goal Achievement: 06/26/22 Potential to Achieve Goals: Good   AM-PAC OT "6 Clicks" Daily Activity     Outcome Measure Help from another person eating meals?: A Little Help from another person taking care of personal grooming?: A Little Help from another person toileting, which includes using toliet, bedpan, or urinal?: A Little Help from another person bathing (including washing, rinsing, drying)?: A Little Help from another person to put on and taking off regular upper body clothing?: A Little Help from another person to put on and taking off regular lower body clothing?: A Little 6 Click Score: 18   End of Session    Activity Tolerance: Patient tolerated treatment well Patient left: in chair;with call bell/phone within reach;with family/visitor present  OT Visit Diagnosis: Muscle weakness (generalized) (M62.81)                Time: [id number] OT Time Calculation (min): 27 min Charges:  OT General Charges $OT Visit: 1 Visit OT Evaluation $OT Eval Low Complexity: 1 Low OT Treatments $Neuromuscular Re-education: 8-22 mins  Dessie Coma, M.S. OTR/L  06/12/22, 10:35 AM  ascom [phone]

## 2022-06-17 ENCOUNTER — Other Ambulatory Visit: Payer: Self-pay

## 2022-06-17 DIAGNOSIS — Z981 Arthrodesis status: Secondary | ICD-10-CM

## 2022-06-18 ENCOUNTER — Ambulatory Visit (INDEPENDENT_AMBULATORY_CARE_PROVIDER_SITE_OTHER): Payer: Worker's Compensation | Admitting: Neurosurgery

## 2022-06-18 ENCOUNTER — Encounter: Payer: Self-pay | Admitting: Neurosurgery

## 2022-06-18 ENCOUNTER — Ambulatory Visit
Admission: RE | Admit: 2022-06-18 | Discharge: 2022-06-18 | Disposition: A | Discharge status: Home / Self Care | Payer: Worker's Compensation | Source: Ambulatory Visit | Attending: Neurosurgery | Admitting: Neurosurgery

## 2022-06-18 VITALS — BP 150/90 | HR 85 | Temp 97.9°F | Ht 70.0 in | Wt 193.8 lb

## 2022-06-18 DIAGNOSIS — Z981 Arthrodesis status: Secondary | ICD-10-CM

## 2022-06-18 DIAGNOSIS — G8929 Other chronic pain: Secondary | ICD-10-CM

## 2022-06-18 DIAGNOSIS — M5441 Lumbago with sciatica, right side: Secondary | ICD-10-CM

## 2022-06-18 NOTE — Progress Notes (Signed)
   REFERRING PHYSICIAN:  No referring provider defined for this encounter.  DOS: 03/27/22 L4-S1 TLIF/PSF  HISTORY OF PRESENT ILLNESS: Erik Wallace is status post L4-S1 TLIF/PSF.   He has minimal discomfort.  He is doing extremely well.  He is back at work in a different role that does not require lifting.  He had a stroke last week.  He is doing well from that.    PHYSICAL EXAMINATION:  Vitals:   06/18/22 0949  BP: (!) 150/90  Pulse: 85  Temp: 97.9 F (36.6 C)  SpO2: 97%    General: Patient is well developed, well nourished, calm, collected, and in no apparent distress.   NEUROLOGICAL:  General: In no acute distress.   Awake, alert, oriented to person, place, and time.  Pupils equal round and reactive to light.  Facial tone is symmetric.     Strength:            Side Iliopsoas Quads Hamstring PF DF EHL  R 5 5 5 5 5 5  $ L 5 5 5 5 5 5   $ Incisions c/d/i   ROS (Neurologic):  Negative except as noted above  IMAGING: No complications noted  ASSESSMENT/PLAN:  Erik Wallace is doing well s/p above surgery.  He will follow-up with neurology in approximately 4 weeks.  He is seeing a new primary care provider.  I have encouraged him to pursue these.  He will be off lifting restrictions in approximately 1 week.  I will see him back in 6 months with x-rays.  Meade Maw, MD  Department of neurosurgery

## 2022-07-09 ENCOUNTER — Telehealth: Payer: Self-pay | Admitting: Neurosurgery

## 2022-07-09 DIAGNOSIS — Z981 Arthrodesis status: Secondary | ICD-10-CM

## 2022-07-09 NOTE — Telephone Encounter (Signed)
Patient left a voice message yesterday at 4:14pm He still feels tightness on his left side. He would like to do physical therapy to learn how to stretch. Do you recommend he have PT?

## 2022-07-09 NOTE — Telephone Encounter (Signed)
Patient aware of PT referral and it has been faxed to [facility] w/c adjuster

## 2022-07-09 NOTE — Telephone Encounter (Signed)
Referral placed.

## 2022-07-25 ENCOUNTER — Ambulatory Visit (INDEPENDENT_AMBULATORY_CARE_PROVIDER_SITE_OTHER): Payer: Commercial Managed Care - PPO | Admitting: Internal Medicine

## 2022-07-25 ENCOUNTER — Encounter: Payer: Self-pay | Admitting: Internal Medicine

## 2022-07-25 VITALS — BP 138/90 | HR 86 | Temp 97.6°F | Resp 16 | Ht 70.0 in | Wt 195.8 lb

## 2022-07-25 DIAGNOSIS — Z125 Encounter for screening for malignant neoplasm of prostate: Secondary | ICD-10-CM

## 2022-07-25 DIAGNOSIS — Z23 Encounter for immunization: Secondary | ICD-10-CM | POA: Diagnosis not present

## 2022-07-25 DIAGNOSIS — Z8673 Personal history of transient ischemic attack (TIA), and cerebral infarction without residual deficits: Secondary | ICD-10-CM

## 2022-07-25 DIAGNOSIS — E782 Mixed hyperlipidemia: Secondary | ICD-10-CM

## 2022-07-25 DIAGNOSIS — I10 Essential (primary) hypertension: Secondary | ICD-10-CM

## 2022-07-25 DIAGNOSIS — R5383 Other fatigue: Secondary | ICD-10-CM | POA: Diagnosis not present

## 2022-07-25 DIAGNOSIS — Z1211 Encounter for screening for malignant neoplasm of colon: Secondary | ICD-10-CM

## 2022-07-25 MED ORDER — LOSARTAN POTASSIUM 25 MG PO TABS: 25.0000 mg | ORAL_TABLET | Freq: Every day | ORAL | 1 refills | Status: DC

## 2022-07-25 NOTE — Progress Notes (Signed)
New Patient Office Visit  Subjective    Patient ID: Erik Wallace, male    DOB: 25-Apr-1965  Age: 57 y.o. MRN: [id number]  CC:  Chief Complaint  Patient presents with   Establish Care    Specialist neuro surgeon    HPI Erik Wallace presents to establish care. He was previously healthy and took no daily medications until stroke in February. Complaining of some fatigue today.  Hypertension: -Medications: Losartan 25 mg -Patient is compliant with above medications and reports no side effects. -Checking BP at home (average): 110-120/70-80 -Denies any SOB, CP, vision changes, LE edema or symptoms of hypotension  HLD/Hx of CVA: -Medications: Lipitor 80 mg, aspirin  -Patient is compliant with above medications and reports no side effects.  -Last lipid panel: Lipid Panel     Component Value Date/Time   CHOL 245 (H) 06/12/2022 0338   TRIG 332 (H) 06/12/2022 0338   HDL 38 (L) 06/12/2022 0338   CHOLHDL 6.4 06/12/2022 0338   VLDL 66 (H) 06/12/2022 0338   LDLCALC 141 (H) 06/12/2022 0338   Hx of Pre-Diabetes: -Highest A1c 5.9% 1 year ago, 5.5 in 2/24  Follows with Dr. Diamantina Wallace since he had L4-S1 TLIF/PSF in December of 2023.   Health Maintenance: -Blood work UTD -Colon cancer screening  -Tdap due   Outpatient Encounter Medications as of 07/25/2022  Medication Sig   aspirin EC 81 MG tablet Take 1 tablet (81 mg total) by mouth daily. Swallow whole.   atorvastatin (LIPITOR) 80 MG tablet Take 1 tablet (80 mg total) by mouth every evening.   losartan (COZAAR) 25 MG tablet Take 1 tablet (25 mg total) by mouth daily.   No facility-administered encounter medications on file as of 07/25/2022.    Past Medical History:  Diagnosis Date   Anxiety    Back pain    Depression    Elevated blood pressure reading in office without diagnosis of hypertension    due to back pain   Hypertension 06-11-22   Lumbar radiculopathy    Spinal stenosis at L4-L5 level    Stroke 06-11-22     Past Surgical History:  Procedure Laterality Date   APPLICATION OF INTRAOPERATIVE CT SCAN N/A 03/27/2022   Procedure: APPLICATION OF INTRAOPERATIVE CT SCAN;  Surgeon: Meade Maw, MD;  Location: [facility] ORS;  Service: Neurosurgery;  Laterality: N/A;   HERNIA REPAIR Left    inguinal hernia   L4-5 decompressio     Left L5-S1 discectomy     LUMBAR LAMINECTOMY/DECOMPRESSION MICRODISCECTOMY N/A 07/18/2021   Procedure: L4-5 POSTERIOR SPINAL DECOMPRESSION, LEFT L5-S1  MICRODISCECTOMY;  Surgeon: Meade Maw, MD;  Location: [facility] ORS;  Service: Neurosurgery;  Laterality: N/A;   SPINE SURGERY  03-28-23   TRANSFORAMINAL LUMBAR INTERBODY FUSION W/ MIS 2 LEVEL N/A 03/27/2022   Procedure: L4-S1 MINIMALLY INVASIVE (MIS) TRANSFORAMINAL LUMBAR INTERBODY FUSION (TLIF);  Surgeon: Meade Maw, MD;  Location: [facility] ORS;  Service: Neurosurgery;  Laterality: N/A;    Family History  Problem Relation Age of Onset   Heart disease Mother     Social History   Socioeconomic History   Marital status: Married    Spouse name: Not on file   Number of children: Not on file   Years of education: Not on file   Highest education level: Not on file  Occupational History   Not on file  Tobacco Use   Smoking status: Never   Smokeless tobacco: Never  Vaping Use   Vaping Use: Never used  Substance  and Sexual Activity   Alcohol use: Not Currently    Alcohol/week: 3.0 standard drinks of alcohol    Types: 1 Glasses of wine, 1 Cans of beer, 1 Shots of liquor per week    Comment: occassionally   Drug use: Never   Sexual activity: Yes  Other Topics Concern   Not on file  Social History Narrative   Not on file   Social Determinants of Health   Financial Resource Strain: Not on file  Food Insecurity: No Food Insecurity (06/11/2022)   Hunger Vital Sign    Worried About Running Out of Food in the Last Year: Never true    Ran Out of Food in the Last Year: Never true  Transportation Needs: No  Transportation Needs (06/11/2022)   PRAPARE - Hydrologist (Medical): No    Lack of Transportation (Non-Medical): No  Physical Activity: Not on file  Stress: Not on file  Social Connections: Not on file  Intimate Partner Violence: Not At Risk (06/11/2022)   Humiliation, Afraid, Rape, and Kick questionnaire    Fear of Current or Ex-Partner: No    Emotionally Abused: No    Physically Abused: No    Sexually Abused: No    Review of Systems  Constitutional:  Positive for weight loss. Negative for chills and fever.  Eyes:  Negative for blurred vision.  Respiratory:  Negative for shortness of breath.   Cardiovascular:  Negative for chest pain.  Neurological:  Negative for headaches.        Objective    BP (!) 138/90   Pulse 86   Temp 97.6 F (36.4 C)   Resp 16   Ht 5\' 10"  (1.778 m)   Wt 195 lb 12.8 oz (88.8 kg)   SpO2 98%   BMI 28.09 kg/m   Physical Exam Constitutional:      Appearance: Normal appearance.  HENT:     Head: Normocephalic and atraumatic.     Mouth/Throat:     Mouth: Mucous membranes are moist.     Pharynx: Oropharynx is clear.  Eyes:     Extraocular Movements: Extraocular movements intact.     Conjunctiva/sclera: Conjunctivae normal.     Pupils: Pupils are equal, round, and reactive to light.  Cardiovascular:     Rate and Rhythm: Normal rate and regular rhythm.  Pulmonary:     Effort: Pulmonary effort is normal.     Breath sounds: Normal breath sounds.  Musculoskeletal:     Right lower leg: No edema.     Left lower leg: No edema.  Skin:    General: Skin is warm and dry.  Neurological:     General: No focal deficit present.     Mental Status: He is alert. Mental status is at baseline.  Psychiatric:        Mood and Affect: Mood normal.        Behavior: Behavior normal.         Assessment & Plan:   1. Hypertension, unspecified type: Blood pressure borderline here today, better at home. Continue Losartan 25 mg,  refilled today. Continue to monitor BP at home.  - CBC w/Diff/Platelet - COMPLETE METABOLIC PANEL WITH GFR - losartan (COZAAR) 25 MG tablet; Take 1 tablet (25 mg total) by mouth daily.  Dispense: 90 tablet; Refill: 1  2. Mixed hyperlipidemia/ History of CVA (cerebrovascular accident): Stable, reviewed hospital discharge summary and imaging and procedures from that inpatient stay. Carotids with minimal stenosis, echo normal  but cholesterol elevated. Patient doing well on Lipitor 80 mg and aspirin.  - CBC w/Diff/Platelet - COMPLETE METABOLIC PANEL WITH GFR  3. Other fatigue: Check TSH and testosterone today but MDD screening elevated as well. Patient not interested in counseling or medications, continue to monitor.   - TSH - Testosterone  4. Prostate cancer screening: PSA ordered.   - PSA  5. Screening for colon cancer: Referral for colonoscopy ordered.   - Ambulatory referral to Gastroenterology  6. Need for tetanus, diphtheria, and acellular pertussis (Tdap) vaccine: Tdap vaccine administered.   - Tdap vaccine greater than or equal to 7yo IM   Return in about 6 months (around 01/24/2023).   Teodora Medici, DO

## 2022-07-25 NOTE — Patient Instructions (Addendum)
It was great seeing you today!  Plan discussed at today's visit: -Blood work ordered today, results will be uploaded to [facility].  -Tdap vaccine today -Referral to GI placed today for colonoscopy -Continue all medications and continue to monitor blood pressure at home - let me know if average is starting to be higher than 140/90  Follow up in: 6 months or sooner as needed  Take care and let us know if you have any questions or concerns prior to your next visit.  Dr. Rosana Berger

## 2022-07-26 LAB — COMPLETE METABOLIC PANEL WITH GFR
AG Ratio: 1.6 (calc) (ref 1.0–2.5)
ALT: 55 U/L — ABNORMAL HIGH (ref 9–46)
AST: 30 U/L (ref 10–35)
Albumin: 4.6 g/dL (ref 3.6–5.1)
Alkaline phosphatase (APISO): 49 U/L (ref 35–144)
BUN: 21 mg/dL (ref 7–25)
CO2: 26 mmol/L (ref 20–32)
Calcium: 10.2 mg/dL (ref 8.6–10.3)
Chloride: 103 mmol/L (ref 98–110)
Creat: 0.89 mg/dL (ref 0.70–1.30)
Globulin: 2.8 g/dL (calc) (ref 1.9–3.7)
Glucose, Bld: 98 mg/dL (ref 65–99)
Potassium: 4.5 mmol/L (ref 3.5–5.3)
Sodium: 141 mmol/L (ref 135–146)
Total Bilirubin: 1.1 mg/dL (ref 0.2–1.2)
Total Protein: 7.4 g/dL (ref 6.1–8.1)
eGFR: 101 mL/min/{1.73_m2} (ref >=60)

## 2022-07-26 LAB — CBC WITH DIFFERENTIAL/PLATELET
Absolute Monocytes: 706 cells/uL (ref 200–950)
Basophils Absolute: 33 cells/uL (ref 0–200)
Basophils Relative: 0.5 %
Eosinophils Absolute: 158 cells/uL (ref 15–500)
Eosinophils Relative: 2.4 %
HCT: 48.6 % (ref 38.5–50.0)
Hemoglobin: 16.5 g/dL (ref 13.2–17.1)
Lymphs Abs: 2105.4 cells/uL (ref 850–3900)
MCH: 31.3 pg (ref 27.0–33.0)
MCHC: 34 g/dL (ref 32.0–36.0)
MCV: 92 fL (ref 80.0–100.0)
MPV: 9.8 fL (ref 7.5–12.5)
Monocytes Relative: 10.7 %
Neutro Abs: 3597 cells/uL (ref 1500–7800)
Neutrophils Relative %: 54.5 %
Platelets: 284 10*3/uL (ref 140–400)
RBC: 5.28 10*6/uL (ref 4.20–5.80)
RDW: 13.1 % (ref 11.0–15.0)
Total Lymphocyte: 31.9 %
WBC: 6.6 10*3/uL (ref 3.8–10.8)

## 2022-07-26 LAB — TSH: TSH: 2.32 mIU/L (ref 0.40–4.50)

## 2022-07-26 LAB — TESTOSTERONE: Testosterone: 340 ng/dL (ref 250–827)

## 2022-07-26 LAB — PSA: PSA: 1.07 ng/mL (ref <=4.00)

## 2022-08-05 ENCOUNTER — Encounter: Payer: Self-pay | Admitting: *Deleted

## 2022-12-03 ENCOUNTER — Encounter: Payer: Self-pay | Admitting: Internal Medicine

## 2022-12-04 ENCOUNTER — Other Ambulatory Visit: Payer: Self-pay | Admitting: Internal Medicine

## 2022-12-04 DIAGNOSIS — E559 Vitamin D deficiency, unspecified: Secondary | ICD-10-CM

## 2022-12-04 MED ORDER — VITAMIN D (ERGOCALCIFEROL) 1.25 MG (50000 UNIT) PO CAPS: 50000.0000 [IU] | ORAL_CAPSULE | ORAL | 0 refills | Status: DC

## 2022-12-06 ENCOUNTER — Encounter: Payer: Self-pay | Admitting: Neurosurgery

## 2022-12-07 ENCOUNTER — Encounter: Payer: Self-pay | Admitting: Internal Medicine

## 2022-12-20 ENCOUNTER — Other Ambulatory Visit: Payer: Self-pay

## 2022-12-20 DIAGNOSIS — G8929 Other chronic pain: Secondary | ICD-10-CM

## 2022-12-24 ENCOUNTER — Ambulatory Visit
Admission: RE | Admit: 2022-12-24 | Discharge: 2022-12-24 | Disposition: A | Discharge status: Home / Self Care | Payer: Worker's Compensation | Attending: Neurosurgery | Admitting: Neurosurgery

## 2022-12-24 ENCOUNTER — Ambulatory Visit
Admission: RE | Admit: 2022-12-24 | Discharge: 2022-12-24 | Disposition: A | Discharge status: Home / Self Care | Payer: Worker's Compensation | Source: Ambulatory Visit | Attending: Neurosurgery | Admitting: Neurosurgery

## 2022-12-24 ENCOUNTER — Ambulatory Visit (INDEPENDENT_AMBULATORY_CARE_PROVIDER_SITE_OTHER): Payer: Worker's Compensation | Admitting: Neurosurgery

## 2022-12-24 ENCOUNTER — Encounter: Payer: Self-pay | Admitting: Neurosurgery

## 2022-12-24 VITALS — BP 140/72 | Ht 70.5 in | Wt 195.2 lb

## 2022-12-24 DIAGNOSIS — M5442 Lumbago with sciatica, left side: Secondary | ICD-10-CM | POA: Diagnosis present

## 2022-12-24 DIAGNOSIS — M5441 Lumbago with sciatica, right side: Secondary | ICD-10-CM | POA: Insufficient documentation

## 2022-12-24 DIAGNOSIS — Z09 Encounter for follow-up examination after completed treatment for conditions other than malignant neoplasm: Secondary | ICD-10-CM | POA: Diagnosis not present

## 2022-12-24 DIAGNOSIS — G8929 Other chronic pain: Secondary | ICD-10-CM

## 2022-12-24 NOTE — Progress Notes (Signed)
   REFERRING PHYSICIAN:  No referring provider defined for this encounter.  DOS: 03/27/22 L4-S1 TLIF/PSF  HISTORY OF PRESENT ILLNESS: Erik Wallace is status post L4-S1 TLIF/PSF.  He is doing extremely well.  He is back at work.   PHYSICAL EXAMINATION:  Vitals:   12/24/22 0916  BP: (!) 140/72    General: Patient is well developed, well nourished, calm, collected, and in no apparent distress.   NEUROLOGICAL:  General: In no acute distress.   Awake, alert, oriented to person, place, and time.  Pupils equal round and reactive to light.  Facial tone is symmetric.     Strength:            Side Iliopsoas Quads Hamstring PF DF EHL  R 5 5 5 5 5 5   L 5 5 5 5 5 5    Incisions c/d/i   ROS (Neurologic):  Negative except as noted above  IMAGING: No complications noted  ASSESSMENT/PLAN:  Erik Wallace is doing well s/p above surgery.    I am very pleased with his improvements.  I will see him back on an as-needed basis.  He may need to obtain a rating due to his Worker's Compensation issue.  I let Erik Wallace and his Water engineer know that we do not perform that service in the office.  Erik Wallace I were reviewed that I am okay with his current exercise plan, but did not recommend any heavy lifting above his shoulders.  I also recommended against any power lifting.  Venetia Night, MD  Department of neurosurgery

## 2023-01-02 ENCOUNTER — Encounter: Payer: Self-pay | Admitting: Internal Medicine

## 2023-01-03 ENCOUNTER — Other Ambulatory Visit: Payer: Self-pay | Admitting: Internal Medicine

## 2023-01-03 DIAGNOSIS — I10 Essential (primary) hypertension: Secondary | ICD-10-CM

## 2023-01-03 DIAGNOSIS — E782 Mixed hyperlipidemia: Secondary | ICD-10-CM

## 2023-01-03 MED ORDER — LOSARTAN POTASSIUM 25 MG PO TABS: 25.0000 mg | ORAL_TABLET | Freq: Every day | ORAL | 1 refills | Status: DC

## 2023-01-03 MED ORDER — ATORVASTATIN CALCIUM 80 MG PO TABS: 80.0000 mg | ORAL_TABLET | Freq: Every evening | ORAL | 1 refills | Status: DC

## 2023-01-23 NOTE — Progress Notes (Signed)
Established Patient Office Visit  Subjective    Patient ID: Erik Wallace, male    DOB: [birth year]  Age: 57 y.o. MRN: [id number]  CC:  Chief Complaint  Patient presents with   Follow-up    HPI Erik Wallace presents to follow up on chronic medical conditions.   Hypertension: -Medications: Losartan 25 mg -Patient is compliant with above medications and reports no side effects. -Checking BP at home (average): 130/85  -Denies any SOB, CP, vision changes, LE edema or symptoms of hypotension  HLD/Hx of CVA: -Medications: Lipitor 80 mg, aspirin  -Patient is compliant with above medications and reports no side effects.  -Last lipid panel: Lipid Panel     Component Value Date/Time   CHOL 245 (H) 06/12/2022 0338   TRIG 332 (H) 06/12/2022 0338   HDL 38 (L) 06/12/2022 0338   CHOLHDL 6.4 06/12/2022 0338   VLDL 66 (H) 06/12/2022 0338   LDLCALC 141 (H) 06/12/2022 0338   Hx of Pre-Diabetes: -Highest A1c 5.9% 1 year ago, 5.5 in 2/24  Chronic Bilateral Low Back Pain with Sciatica:  Follows with Dr. Melrose Nakayama since he had L4-S1 TLIF/PSF in December of 2023.  -Last seen 12/24/22  Health Maintenance: -Blood work UTD -Colon cancer screening due  Outpatient Encounter Medications as of 01/24/2023  Medication Sig   aspirin EC 81 MG tablet Take 1 tablet (81 mg total) by mouth daily. Swallow whole.   atorvastatin (LIPITOR) 80 MG tablet Take 1 tablet (80 mg total) by mouth every evening.   losartan (COZAAR) 25 MG tablet Take 1 tablet (25 mg total) by mouth daily.   Vitamin D, Ergocalciferol, (DRISDOL) 1.25 MG (50000 UNIT) CAPS capsule Take 1 capsule (50,000 Units total) by mouth every 7 (seven) days.   No facility-administered encounter medications on file as of 01/24/2023.    Past Medical History:  Diagnosis Date   Anxiety    Back pain    Depression    Elevated blood pressure reading in office without diagnosis of hypertension    due to back pain   Hypertension 06-11-22    Lumbar radiculopathy    Spinal stenosis at L4-L5 level    Stroke [facility]) 06-11-22    Past Surgical History:  Procedure Laterality Date   APPLICATION OF INTRAOPERATIVE CT SCAN N/A 03/27/2022   Procedure: APPLICATION OF INTRAOPERATIVE CT SCAN;  Surgeon: Venetia Night, MD;  Location: [facility] ORS;  Service: Neurosurgery;  Laterality: N/A;   HERNIA REPAIR Left    inguinal hernia   L4-5 decompressio     Left L5-S1 discectomy     LUMBAR LAMINECTOMY/DECOMPRESSION MICRODISCECTOMY N/A 07/18/2021   Procedure: L4-5 POSTERIOR SPINAL DECOMPRESSION, LEFT L5-S1  MICRODISCECTOMY;  Surgeon: Venetia Night, MD;  Location: [facility] ORS;  Service: Neurosurgery;  Laterality: N/A;   SPINE SURGERY  03-28-23   TRANSFORAMINAL LUMBAR INTERBODY FUSION W/ MIS 2 LEVEL N/A 03/27/2022   Procedure: L4-S1 MINIMALLY INVASIVE (MIS) TRANSFORAMINAL LUMBAR INTERBODY FUSION (TLIF);  Surgeon: Venetia Night, MD;  Location: [facility] ORS;  Service: Neurosurgery;  Laterality: N/A;    Family History  Problem Relation Age of Onset   Heart disease Mother     Social History   Socioeconomic History   Marital status: Married    Spouse name: Not on file   Number of children: Not on file   Years of education: Not on file   Highest education level: Associate degree: academic program  Occupational History   Not on file  Tobacco Use   Smoking status: Never  Smokeless tobacco: Never  Vaping Use   Vaping status: Never Used  Substance and Sexual Activity   Alcohol use: Not Currently    Alcohol/week: 3.0 standard drinks of alcohol    Types: 1 Glasses of wine, 1 Cans of beer, 1 Shots of liquor per week    Comment: occassionally   Drug use: Never   Sexual activity: Yes  Other Topics Concern   Not on file  Social History Narrative   Not on file   Social Determinants of Health   Financial Resource Strain: Patient Declined (01/20/2023)   Overall Financial Resource Strain (CARDIA)    Difficulty of Paying Living Expenses:  Patient declined  Food Insecurity: Patient Declined (01/20/2023)   Hunger Vital Sign    Worried About Running Out of Food in the Last Year: Patient declined    Ran Out of Food in the Last Year: Patient declined  Transportation Needs: Patient Declined (01/20/2023)   PRAPARE - Administrator, Civil Service (Medical): Patient declined    Lack of Transportation (Non-Medical): Patient declined  Physical Activity: Unknown (01/20/2023)   Exercise Vital Sign    Days of Exercise per Week: Patient declined    Minutes of Exercise per Session: Not on file  Stress: No Stress Concern Present (01/20/2023)   [employer] of Occupational Health - Occupational Stress Questionnaire    Feeling of Stress : Only a little  Social Connections: Unknown (01/20/2023)   Social Connection and Isolation Panel [NHANES]    Frequency of Communication with Friends and Family: More than three times a week    Frequency of Social Gatherings with Friends and Family: Once a week    Attends Religious Services: Patient declined    Database administrator or Organizations: Patient declined    Attends Banker Meetings: Not on file    Marital Status: Married  Intimate Partner Violence: Not At Risk (06/11/2022)   Humiliation, Afraid, Rape, and Kick questionnaire    Fear of Current or Ex-Partner: No    Emotionally Abused: No    Physically Abused: No    Sexually Abused: No    Review of Systems  Constitutional:  Negative for chills and fever.  Eyes:  Negative for blurred vision.  Respiratory:  Negative for shortness of breath.   Cardiovascular:  Negative for chest pain.  Neurological:  Negative for headaches.        Objective    BP (!) 142/92   Pulse 86   Temp 97.8 F (36.6 C)   Resp 18   Ht 5\' 10"  (1.778 m)   Wt 200 lb 1.6 oz (90.8 kg)   SpO2 97%   BMI 28.71 kg/m   Physical Exam Constitutional:      Appearance: Normal appearance.  HENT:     Head: Normocephalic and atraumatic.   Eyes:     Conjunctiva/sclera: Conjunctivae normal.  Cardiovascular:     Rate and Rhythm: Normal rate and regular rhythm.  Pulmonary:     Effort: Pulmonary effort is normal.     Breath sounds: Normal breath sounds.  Skin:    General: Skin is warm and dry.  Neurological:     General: No focal deficit present.     Mental Status: He is alert. Mental status is at baseline.  Psychiatric:        Mood and Affect: Mood normal.        Behavior: Behavior normal.         Assessment & Plan:  1. Hypertension, unspecified type: Blood pressure borderline today, patient states he just took his medication and has been getting lower at home. I recommend increasing Losartan to 50 mg for further protection however patient would like to continue the 25 mg and continue to monitor at home. Refills sent in previously.   2. History of CVA (cerebrovascular accident)/Mixed hyperlipidemia: Doing well on statin and aspirin.   3. Colon cancer screening: Referral placed for colonoscopy.   - Ambulatory referral to Gastroenterology   Return in about 6 months (around 07/25/2023).   Margarita Mail, DO

## 2023-01-24 ENCOUNTER — Telehealth: Payer: Self-pay

## 2023-01-24 ENCOUNTER — Ambulatory Visit (INDEPENDENT_AMBULATORY_CARE_PROVIDER_SITE_OTHER): Payer: No Typology Code available for payment source | Admitting: Internal Medicine

## 2023-01-24 ENCOUNTER — Encounter: Payer: Self-pay | Admitting: Internal Medicine

## 2023-01-24 ENCOUNTER — Other Ambulatory Visit: Payer: Self-pay | Admitting: Internal Medicine

## 2023-01-24 VITALS — BP 142/92 | HR 86 | Temp 97.8°F | Resp 18 | Ht 70.0 in | Wt 200.1 lb

## 2023-01-24 DIAGNOSIS — E782 Mixed hyperlipidemia: Secondary | ICD-10-CM

## 2023-01-24 DIAGNOSIS — Z8673 Personal history of transient ischemic attack (TIA), and cerebral infarction without residual deficits: Secondary | ICD-10-CM | POA: Diagnosis not present

## 2023-01-24 DIAGNOSIS — Z1211 Encounter for screening for malignant neoplasm of colon: Secondary | ICD-10-CM | POA: Diagnosis not present

## 2023-01-24 DIAGNOSIS — I10 Essential (primary) hypertension: Secondary | ICD-10-CM | POA: Diagnosis not present

## 2023-01-24 DIAGNOSIS — Z1212 Encounter for screening for malignant neoplasm of rectum: Secondary | ICD-10-CM

## 2023-01-24 NOTE — Telephone Encounter (Signed)
Pt requesting call back to schedule colonoscopy.

## 2023-01-27 ENCOUNTER — Other Ambulatory Visit: Payer: Self-pay

## 2023-01-27 ENCOUNTER — Encounter: Payer: Self-pay | Admitting: Internal Medicine

## 2023-01-27 ENCOUNTER — Telehealth: Payer: Self-pay

## 2023-01-27 DIAGNOSIS — Z1211 Encounter for screening for malignant neoplasm of colon: Secondary | ICD-10-CM

## 2023-01-27 MED ORDER — NA SULFATE-K SULFATE-MG SULF 17.5-3.13-1.6 GM/177ML PO SOLN: 1.0000 | Freq: Once | ORAL | 0 refills | Status: AC

## 2023-01-27 NOTE — Telephone Encounter (Signed)
Gastroenterology Pre-Procedure Review  Request Date: 03/07/23 Requesting Physician: Dr. Allegra Lai  PATIENT REVIEW QUESTIONS: The patient responded to the following health history questions as indicated:    1. Are you having any GI issues? no 2. Do you have a personal history of Polyps? no 3. Do you have a family history of Colon Cancer or Polyps? no 4. Diabetes Mellitus? no 5. Joint replacements in the past 12 months?no 6. Major health problems in the past 3 months?no 7. Any artificial heart valves, MVP, or defibrillator?no    MEDICATIONS & ALLERGIES:    Patient reports the following regarding taking any anticoagulation/antiplatelet therapy:   Plavix, Coumadin, Eliquis, Xarelto, Lovenox, Pradaxa, Brilinta, or Effient? no Aspirin? Yes 81 mg daily  Patient confirms/reports the following medications:  Current Outpatient Medications  Medication Sig Dispense Refill   Na Sulfate-K Sulfate-Mg Sulf 17.5-3.13-1.6 GM/177ML SOLN Take 1 kit by mouth once for 1 dose. 354 mL 0   aspirin EC 81 MG tablet Take 1 tablet (81 mg total) by mouth daily. Swallow whole. 30 tablet 12   atorvastatin (LIPITOR) 80 MG tablet Take 1 tablet (80 mg total) by mouth every evening. 90 tablet 1   losartan (COZAAR) 25 MG tablet Take 1 tablet (25 mg total) by mouth daily. 90 tablet 1   Vitamin D, Ergocalciferol, (DRISDOL) 1.25 MG (50000 UNIT) CAPS capsule Take 1 capsule (50,000 Units total) by mouth every 7 (seven) days. 12 capsule 0   No current facility-administered medications for this visit.    Patient confirms/reports the following allergies:  No Known Allergies  No orders of the defined types were placed in this encounter.   AUTHORIZATION INFORMATION Primary Insurance: 1D#: Group #:  Secondary Insurance: 1D#: Group #:  SCHEDULE INFORMATION: Date: 03/07/23 Time: Location: [facility]

## 2023-03-07 ENCOUNTER — Ambulatory Visit: Payer: No Typology Code available for payment source | Admitting: Anesthesiology

## 2023-03-07 ENCOUNTER — Encounter: Payer: Self-pay | Admitting: Gastroenterology

## 2023-03-07 ENCOUNTER — Encounter
Admission: RE | Disposition: A | Discharge status: Home / Self Care | Payer: Self-pay | Source: Home / Self Care | Attending: Gastroenterology

## 2023-03-07 ENCOUNTER — Ambulatory Visit
Admission: RE | Admit: 2023-03-07 | Discharge: 2023-03-07 | Disposition: A | Discharge status: Home / Self Care | Payer: No Typology Code available for payment source | Attending: Gastroenterology | Admitting: Gastroenterology

## 2023-03-07 DIAGNOSIS — K635 Polyp of colon: Secondary | ICD-10-CM

## 2023-03-07 DIAGNOSIS — D124 Benign neoplasm of descending colon: Secondary | ICD-10-CM

## 2023-03-07 DIAGNOSIS — D123 Benign neoplasm of transverse colon: Secondary | ICD-10-CM | POA: Insufficient documentation

## 2023-03-07 DIAGNOSIS — I10 Essential (primary) hypertension: Secondary | ICD-10-CM | POA: Diagnosis not present

## 2023-03-07 DIAGNOSIS — Z1211 Encounter for screening for malignant neoplasm of colon: Secondary | ICD-10-CM | POA: Diagnosis present

## 2023-03-07 HISTORY — PX: POLYPECTOMY: SHX149

## 2023-03-07 HISTORY — PX: COLONOSCOPY WITH PROPOFOL: SHX5780

## 2023-03-07 SURGERY — COLONOSCOPY WITH PROPOFOL
Anesthesia: General

## 2023-03-07 MED ORDER — PROPOFOL 10 MG/ML IV BOLUS
INTRAVENOUS | Status: DC | PRN
  Administered 2023-03-07: 20 mg via INTRAVENOUS
  Administered 2023-03-07: 80 mg via INTRAVENOUS

## 2023-03-07 MED ORDER — PROPOFOL 500 MG/50ML IV EMUL
INTRAVENOUS | Status: DC | PRN
  Administered 2023-03-07: 125 ug/kg/min via INTRAVENOUS

## 2023-03-07 MED ORDER — LIDOCAINE HCL (CARDIAC) PF 100 MG/5ML IV SOSY
PREFILLED_SYRINGE | INTRAVENOUS | Status: DC | PRN
  Administered 2023-03-07: 40 mg via INTRAVENOUS

## 2023-03-07 MED ORDER — SIMETHICONE 40 MG/0.6ML PO SUSP
ORAL | Status: DC | PRN
  Administered 2023-03-07: 20 mg via ORAL

## 2023-03-07 MED ORDER — SODIUM CHLORIDE 0.9 % IV SOLN: INTRAVENOUS | Status: DC

## 2023-03-07 NOTE — H&P (Signed)
Arlyss Repress, MD [address]  [address]  [place], [place] [address]  Main: [phone]  Fax: [phone] Pager: [phone]  Primary Care Physician:  Margarita Mail, DO Primary Gastroenterologist:  Dr. Arlyss Repress  Pre-Procedure History & Physical: HPI:  Erik Wallace is a 57 y.o. male is here for an colonoscopy.   Past Medical History:  Diagnosis Date   Anxiety    Back pain    Depression    Elevated blood pressure reading in office without diagnosis of hypertension    due to back pain   Hypertension 06-11-22   Lumbar radiculopathy    Spinal stenosis at L4-L5 level    Stroke [facility]) 06-11-22    Past Surgical History:  Procedure Laterality Date   APPLICATION OF INTRAOPERATIVE CT SCAN N/A 03/27/2022   Procedure: APPLICATION OF INTRAOPERATIVE CT SCAN;  Surgeon: Venetia Night, MD;  Location: [facility] ORS;  Service: Neurosurgery;  Laterality: N/A;   HERNIA REPAIR Left    inguinal hernia   L4-5 decompressio     Left L5-S1 discectomy     LUMBAR LAMINECTOMY/DECOMPRESSION MICRODISCECTOMY N/A 07/18/2021   Procedure: L4-5 POSTERIOR SPINAL DECOMPRESSION, LEFT L5-S1  MICRODISCECTOMY;  Surgeon: Venetia Night, MD;  Location: [facility] ORS;  Service: Neurosurgery;  Laterality: N/A;   SPINE SURGERY  03-28-23   TRANSFORAMINAL LUMBAR INTERBODY FUSION W/ MIS 2 LEVEL N/A 03/27/2022   Procedure: L4-S1 MINIMALLY INVASIVE (MIS) TRANSFORAMINAL LUMBAR INTERBODY FUSION (TLIF);  Surgeon: Venetia Night, MD;  Location: [facility] ORS;  Service: Neurosurgery;  Laterality: N/A;    Prior to Admission medications   Medication Sig Start Date End Date Taking? Authorizing Provider  aspirin EC 81 MG tablet Take 1 tablet (81 mg total) by mouth daily. Swallow whole. 06/13/22  Yes Arnetha Courser, MD  atorvastatin (LIPITOR) 80 MG tablet Take 1 tablet (80 mg total) by mouth every evening. 01/03/23  Yes Margarita Mail, DO  losartan (COZAAR) 25 MG tablet Take 1 tablet (25 mg total) by mouth daily.  01/03/23  Yes Margarita Mail, DO  Vitamin D, Ergocalciferol, (DRISDOL) 1.25 MG (50000 UNIT) CAPS capsule Take 1 capsule (50,000 Units total) by mouth every 7 (seven) days. 12/04/22  Yes Margarita Mail, DO    Allergies as of 01/27/2023   (No Known Allergies)    Family History  Problem Relation Age of Onset   Heart disease Mother     Social History   Socioeconomic History   Marital status: Married    Spouse name: Not on file   Number of children: Not on file   Years of education: Not on file   Highest education level: Associate degree: academic program  Occupational History   Not on file  Tobacco Use   Smoking status: Never   Smokeless tobacco: Never  Vaping Use   Vaping status: Never Used  Substance and Sexual Activity   Alcohol use: Not Currently    Alcohol/week: 3.0 standard drinks of alcohol    Types: 1 Glasses of wine, 1 Cans of beer, 1 Shots of liquor per week    Comment: occassionally   Drug use: Never   Sexual activity: Yes  Other Topics Concern   Not on file  Social History Narrative   Not on file   Social Determinants of Health   Financial Resource Strain: Patient Declined (01/20/2023)   Overall Financial Resource Strain (CARDIA)    Difficulty of Paying Living Expenses: Patient declined  Food Insecurity: Patient Declined (01/20/2023)   Hunger Vital Sign    Worried  About Running Out of Food in the Last Year: Patient declined    Ran Out of Food in the Last Year: Patient declined  Transportation Needs: Patient Declined (01/20/2023)   PRAPARE - Administrator, Civil Service (Medical): Patient declined    Lack of Transportation (Non-Medical): Patient declined  Physical Activity: Unknown (01/20/2023)   Exercise Vital Sign    Days of Exercise per Week: Patient declined    Minutes of Exercise per Session: Not on file  Stress: No Stress Concern Present (01/20/2023)   [employer] of Occupational Health - Occupational Stress Questionnaire     Feeling of Stress : Only a little  Social Connections: Unknown (01/20/2023)   Social Connection and Isolation Panel [NHANES]    Frequency of Communication with Friends and Family: More than three times a week    Frequency of Social Gatherings with Friends and Family: Once a week    Attends Religious Services: Patient declined    Database administrator or Organizations: Patient declined    Attends Banker Meetings: Not on file    Marital Status: Married  Intimate Partner Violence: Not At Risk (06/11/2022)   Humiliation, Afraid, Rape, and Kick questionnaire    Fear of Current or Ex-Partner: No    Emotionally Abused: No    Physically Abused: No    Sexually Abused: No    Review of Systems: See HPI, otherwise negative ROS  Physical Exam: BP (!) 160/100   Pulse 85   Temp (!) 96.7 F (35.9 C) (Temporal)   Resp 18   Wt 89.4 kg   SpO2 100%   BMI 28.27 kg/m  General:   Alert,  pleasant and cooperative in NAD Head:  Normocephalic and atraumatic. Neck:  Supple; no masses or thyromegaly. Lungs:  Clear throughout to auscultation.    Heart:  Regular rate and rhythm. Abdomen:  Soft, nontender and nondistended. Normal bowel sounds, without guarding, and without rebound.   Neurologic:  Alert and  oriented x4;  grossly normal neurologically.  Impression/Plan: Erik Wallace is here for an colonoscopy to be performed for colon cancer screening  Risks, benefits, limitations, and alternatives regarding  colonoscopy have been reviewed with the patient.  Questions have been answered.  All parties agreeable.   Lannette Donath, MD  03/07/2023, 8:21 AM

## 2023-03-07 NOTE — Anesthesia Preprocedure Evaluation (Signed)
Anesthesia Evaluation  Patient identified by MRN, date of birth, ID band Patient awake    Reviewed: Allergy & Precautions, NPO status , Patient's Chart, lab work & pertinent test results  Airway Mallampati: II  TM Distance: >3 FB Neck ROM: Full    Dental  (+) Teeth Intact   Pulmonary neg pulmonary ROS   Pulmonary exam normal breath sounds clear to auscultation       Cardiovascular Exercise Tolerance: Good hypertension, Pt. on medications negative cardio ROS Normal cardiovascular exam Rhythm:Regular Rate:Normal     Neuro/Psych   Anxiety     CVA negative neurological ROS  negative psych ROS   GI/Hepatic negative GI ROS, Neg liver ROS,,,  Endo/Other  negative endocrine ROS    Renal/GU negative Renal ROS  negative genitourinary   Musculoskeletal   Abdominal Normal abdominal exam  (+)   Peds negative pediatric ROS (+)  Hematology negative hematology ROS (+)   Anesthesia Other Findings Past Medical History: No date: Anxiety No date: Back pain No date: Depression No date: Elevated blood pressure reading in office without diagnosis  of hypertension     Comment:  due to back pain 06-11-22: Hypertension No date: Lumbar radiculopathy No date: Spinal stenosis at L4-L5 level 06-11-22: Stroke [facility])  Past Surgical History: 03/27/2022: APPLICATION OF INTRAOPERATIVE CT SCAN; N/A     Comment:  Procedure: APPLICATION OF INTRAOPERATIVE CT SCAN;                Surgeon: Venetia Night, MD;  Location: [facility] ORS;                Service: Neurosurgery;  Laterality: N/A; No date: HERNIA REPAIR; Left     Comment:  inguinal hernia No date: L4-5 decompressio No date: Left L5-S1 discectomy 07/18/2021: LUMBAR LAMINECTOMY/DECOMPRESSION MICRODISCECTOMY; N/A     Comment:  Procedure: L4-5 POSTERIOR SPINAL DECOMPRESSION, LEFT               L5-S1  MICRODISCECTOMY;  Surgeon: Venetia Night, MD;              Location: [facility] ORS;   Service: Neurosurgery;  Laterality:               N/A; 03-28-23: SPINE SURGERY 03/27/2022: TRANSFORAMINAL LUMBAR INTERBODY FUSION W/ MIS 2 LEVEL; N/A     Comment:  Procedure: L4-S1 MINIMALLY INVASIVE (MIS) TRANSFORAMINAL              LUMBAR INTERBODY FUSION (TLIF);  Surgeon: Venetia Night, MD;  Location: [facility] ORS;  Service: Neurosurgery;              Laterality: N/A;  BMI    Body Mass Index: 28.27 kg/m      Reproductive/Obstetrics negative OB ROS                             Anesthesia Physical Anesthesia Plan  ASA: 3  Anesthesia Plan: General   Post-op Pain Management:    Induction: Intravenous  PONV Risk Score and Plan: Propofol infusion and TIVA  Airway Management Planned: Natural Airway and Nasal Cannula  Additional Equipment:   Intra-op Plan:   Post-operative Plan:   Informed Consent: I have reviewed the patients History and Physical, chart, labs and discussed the procedure including the risks, benefits and alternatives for the proposed anesthesia with the patient or authorized representative who has indicated his/her understanding  and acceptance.     Dental Advisory Given  Plan Discussed with: Anesthesiologist, CRNA and Surgeon  Anesthesia Plan Comments:        Anesthesia Quick Evaluation

## 2023-03-07 NOTE — Anesthesia Postprocedure Evaluation (Signed)
Anesthesia Post Note  Patient: Erik Wallace  Procedure(s) Performed: COLONOSCOPY WITH PROPOFOL POLYPECTOMY INTESTINAL  Patient location during evaluation: PACU Anesthesia Type: General Level of consciousness: awake and awake and alert Pain management: satisfactory to patient Vital Signs Assessment: post-procedure vital signs reviewed and stable Respiratory status: spontaneous breathing Cardiovascular status: stable Anesthetic complications: no   No notable events documented.   Last Vitals:  Vitals:   03/07/23 0935 03/07/23 0945  BP: 127/81 125/84  Pulse: 81 75  Resp: 13 18  Temp:    SpO2: 96% 98%    Last Pain:  Vitals:   03/07/23 0935  TempSrc:   PainSc: 0-No pain                 VAN STAVEREN,Asuna Peth

## 2023-03-07 NOTE — Transfer of Care (Signed)
Immediate Anesthesia Transfer of Care Note  Patient: Erik Wallace  Procedure(s) Performed: COLONOSCOPY WITH PROPOFOL POLYPECTOMY INTESTINAL  Patient Location: PACU  Anesthesia Type:General  Level of Consciousness: awake, alert , and oriented  Airway & Oxygen Therapy: Patient Spontanous Breathing  Post-op Assessment: Report given to RN and Post -op Vital signs reviewed and stable  Post vital signs: stable  Last Vitals:  Vitals Value Taken Time  BP 119/66 03/07/23 0925  Temp 36.2 C 03/07/23 0923  Pulse 88 03/07/23 0926  Resp 15 03/07/23 0926  SpO2 100 % 03/07/23 0926  Vitals shown include unfiled device data.  Last Pain:  Vitals:   03/07/23 0923  TempSrc: Temporal  PainSc: 0-No pain         Complications: No notable events documented.

## 2023-03-07 NOTE — Op Note (Signed)
[facility] Gastroenterology Patient Name: Erik Wallace Procedure Date: 03/07/2023 8:58 AM MRN: [id number] Account #: [id number] Date of Birth: 28-Aug-1965 Admit Type: Outpatient Age: 57 Room: [facility] ENDO ROOM 2 Gender: Male Note Status: Finalized Instrument Name: Nelda Marseille [id number] Procedure:             Colonoscopy Indications:           Screening for colorectal malignant neoplasm, This is                         the patient's first colonoscopy Providers:             Toney Reil MD, MD Referring MD:          Margarita Mail (Referring MD) Medicines:             General Anesthesia Complications:         No immediate complications. Estimated blood loss: None. Procedure:             Pre-Anesthesia Assessment:                        - Prior to the procedure, a History and Physical was                         performed, and patient medications and allergies were                         reviewed. The patient is competent. The risks and                         benefits of the procedure and the sedation options and                         risks were discussed with the patient. All questions                         were answered and informed consent was obtained.                         Patient identification and proposed procedure were                         verified by the physician, the nurse, the                         anesthesiologist, the anesthetist and the technician                         in the pre-procedure area in the procedure room in the                         endoscopy suite. Mental Status Examination: alert and                         oriented. Airway Examination: normal oropharyngeal                         airway and neck mobility. Respiratory Examination:  clear to auscultation. CV Examination: normal.                         Prophylactic Antibiotics: The patient does not require                         prophylactic  antibiotics. Prior Anticoagulants: The                         patient has taken no anticoagulant or antiplatelet                         agents. ASA Grade Assessment: III - A patient with                         severe systemic disease. After reviewing the risks and                         benefits, the patient was deemed in satisfactory                         condition to undergo the procedure. The anesthesia                         plan was to use general anesthesia. Immediately prior                         to administration of medications, the patient was                         re-assessed for adequacy to receive sedatives. The                         heart rate, respiratory rate, oxygen saturations,                         blood pressure, adequacy of pulmonary ventilation, and                         response to care were monitored throughout the                         procedure. The physical status of the patient was                         re-assessed after the procedure.                        After obtaining informed consent, the colonoscope was                         passed under direct vision. Throughout the procedure,                         the patient's blood pressure, pulse, and oxygen                         saturations were monitored continuously. The  Colonoscope was introduced through the anus and                         advanced to the the cecum, identified by appendiceal                         orifice and ileocecal valve. The colonoscopy was                         performed without difficulty. The patient tolerated                         the procedure well. The quality of the bowel                         preparation was evaluated using the BBPS [facility] Bowel                         Preparation Scale) with scores of: Right Colon = 3,                         Transverse Colon = 3 and Left Colon = 3 (entire mucosa                         seen  well with no residual staining, small fragments                         of stool or opaque liquid). The total BBPS score                         equals 9. The ileocecal valve, appendiceal orifice,                         and rectum were photographed. Findings:      The perianal and digital rectal examinations were normal. Pertinent       negatives include normal sphincter tone and no palpable rectal lesions.      Two sessile polyps were found in the descending colon and transverse       colon. The polyps were 3 to 5 mm in size. These polyps were removed with       a cold snare. Resection and retrieval were complete. Estimated blood       loss: none.      The retroflexed view of the distal rectum and anal verge was normal and       showed no anal or rectal abnormalities. Impression:            - Two 3 to 5 mm polyps in the descending colon and in                         the transverse colon, removed with a cold snare.                         Resected and retrieved.                        - The distal rectum and anal verge are normal on  retroflexion view. Recommendation:        - Discharge patient to home (with escort).                        - Resume previous diet today.                        - Continue present medications.                        - Await pathology results.                        - Repeat colonoscopy in 5 to 7 years for surveillance                         based on pathology results. Procedure Code(s):     --- Professional ---                        [phone], Colonoscopy, flexible; with removal of                         tumor(s), polyp(s), or other lesion(s) by snare                         technique Diagnosis Code(s):     --- Professional ---                        Z12.11, Encounter for screening for malignant neoplasm                         of colon                        D12.4, Benign neoplasm of descending colon                        D12.3,  Benign neoplasm of transverse colon (hepatic                         flexure or splenic flexure) CPT copyright 2022 American Medical Association. All rights reserved. The codes documented in this report are preliminary and upon coder review may  be revised to meet current compliance requirements. Dr. Libby Maw Toney Reil MD, MD 03/07/2023 9:20:22 AM This report has been signed electronically. Number of Addenda: 0 Note Initiated On: 03/07/2023 8:58 AM Scope Withdrawal Time: 0 hours 9 minutes 49 seconds  Total Procedure Duration: 0 hours 13 minutes 51 seconds  Estimated Blood Loss:  Estimated blood loss: none.      [facility]

## 2023-03-10 ENCOUNTER — Encounter: Payer: Self-pay | Admitting: Gastroenterology

## 2023-03-11 ENCOUNTER — Encounter: Payer: Self-pay | Admitting: Gastroenterology

## 2023-03-11 LAB — SURGICAL PATHOLOGY

## 2023-03-18 ENCOUNTER — Encounter: Payer: Self-pay | Admitting: Internal Medicine

## 2023-06-05 ENCOUNTER — Other Ambulatory Visit: Payer: Self-pay | Admitting: Internal Medicine

## 2023-06-05 DIAGNOSIS — I10 Essential (primary) hypertension: Secondary | ICD-10-CM

## 2023-06-05 NOTE — Telephone Encounter (Signed)
Requested Prescriptions  Pending Prescriptions Disp Refills   losartan (COZAAR) 25 MG tablet [Pharmacy Med Name: LOSARTAN POTASSIUM 25 MG TAB] 90 tablet 0    Sig: TAKE 1 TABLET (25 MG TOTAL) BY MOUTH DAILY.     Cardiovascular:  Angiotensin Receptor Blockers Failed - 06/05/2023  1:52 PM      Failed - Cr in normal range and within 180 days    Creat  Date Value Ref Range Status  07/25/2022 0.89 0.70 - 1.30 mg/dL Final         Failed - K in normal range and within 180 days    Potassium  Date Value Ref Range Status  07/25/2022 4.5 3.5 - 5.3 mmol/L Final         Passed - Patient is not pregnant      Passed - Last BP in normal range    BP Readings from Last 1 Encounters:  03/07/23 125/84         Passed - Valid encounter within last 6 months    Recent Outpatient Visits           4 months ago Hypertension, unspecified type   [facility] Margarita Mail, DO   10 months ago Hypertension, unspecified type   [facility] Margarita Mail, DO       Future Appointments             In 1 month Margarita Mail, DO [facility]

## 2023-07-28 ENCOUNTER — Ambulatory Visit (INDEPENDENT_AMBULATORY_CARE_PROVIDER_SITE_OTHER): Payer: Self-pay | Admitting: Internal Medicine

## 2023-07-28 ENCOUNTER — Other Ambulatory Visit: Payer: Self-pay

## 2023-07-28 ENCOUNTER — Encounter: Payer: Self-pay | Admitting: Internal Medicine

## 2023-07-28 VITALS — BP 130/80 | HR 83 | Temp 98.0°F | Resp 16 | Ht 70.0 in | Wt 204.8 lb

## 2023-07-28 DIAGNOSIS — E782 Mixed hyperlipidemia: Secondary | ICD-10-CM

## 2023-07-28 DIAGNOSIS — Z87898 Personal history of other specified conditions: Secondary | ICD-10-CM | POA: Insufficient documentation

## 2023-07-28 DIAGNOSIS — Z8673 Personal history of transient ischemic attack (TIA), and cerebral infarction without residual deficits: Secondary | ICD-10-CM | POA: Insufficient documentation

## 2023-07-28 DIAGNOSIS — E538 Deficiency of other specified B group vitamins: Secondary | ICD-10-CM | POA: Insufficient documentation

## 2023-07-28 DIAGNOSIS — I10 Essential (primary) hypertension: Secondary | ICD-10-CM

## 2023-07-28 DIAGNOSIS — E559 Vitamin D deficiency, unspecified: Secondary | ICD-10-CM | POA: Insufficient documentation

## 2023-07-28 MED ORDER — ATORVASTATIN CALCIUM 80 MG PO TABS: 80.0000 mg | ORAL_TABLET | Freq: Every evening | ORAL | 1 refills | Status: DC

## 2023-07-28 MED ORDER — LOSARTAN POTASSIUM 25 MG PO TABS: 25.0000 mg | ORAL_TABLET | Freq: Every day | ORAL | 1 refills | Status: DC

## 2023-07-28 NOTE — Progress Notes (Signed)
 Established Patient Office Visit  Subjective    Patient ID: Erik Wallace, male    DOB: 01-Feb-1966  Age: 58 y.o. MRN: [id number]  CC:  Chief Complaint  Patient presents with   Medical Management of Chronic Issues    6 month recheck    HPI Erik Wallace presents to follow up on chronic medical conditions. He has been feeling more stressed lately and not sleeping well. Not interested in medications at this time.   Hypertension: -Medications: Losartan 25 mg -Patient is compliant with above medications and reports no side effects. -Checking BP at home (average): 135-145/85-90 -Denies any SOB, CP, vision changes, LE edema or symptoms of hypotension  HLD/Hx of CVA: -Medications: Lipitor 80 mg, aspirin  -Patient is compliant with above medications and reports no side effects.  -Last lipid panel: Lipid Panel     Component Value Date/Time   CHOL 245 (H) 06/12/2022 0338   TRIG 332 (H) 06/12/2022 0338   HDL 38 (L) 06/12/2022 0338   CHOLHDL 6.4 06/12/2022 0338   VLDL 66 (H) 06/12/2022 0338   LDLCALC 141 (H) 06/12/2022 0338   Hx of Pre-Diabetes: -Highest A1c 5.9% 1 year ago, 5.5 in 2/24  Chronic Bilateral Low Back Pain with Sciatica:  Follows with Dr. Melrose Nakayama since he had L4-S1 TLIF/PSF in December of 2023.  -Last seen 12/24/22  Vitamin D/Vitamin B12 Deficiency: -Took Vitamin D booster supplements last year but not on anything now -Had been doing IM Vitamin B12, now on oral tablets  Health Maintenance: -Blood work due -Colon cancer screening: colonoscopy 11/24, repeat in 5 years  Outpatient Encounter Medications as of 07/28/2023  Medication Sig   aspirin EC 81 MG tablet Take 1 tablet (81 mg total) by mouth daily. Swallow whole.   atorvastatin (LIPITOR) 80 MG tablet Take 1 tablet (80 mg total) by mouth every evening.   losartan (COZAAR) 25 MG tablet TAKE 1 TABLET (25 MG TOTAL) BY MOUTH DAILY.   Vitamin D, Ergocalciferol, (DRISDOL) 1.25 MG (50000 UNIT) CAPS capsule Take  1 capsule (50,000 Units total) by mouth every 7 (seven) days.   No facility-administered encounter medications on file as of 07/28/2023.    Past Medical History:  Diagnosis Date   Anxiety    Back pain    Depression    Elevated blood pressure reading in office without diagnosis of hypertension    due to back pain   Hypertension 06-11-22   Lumbar radiculopathy    Spinal stenosis at L4-L5 level    Stroke [facility]) 06-11-22    Past Surgical History:  Procedure Laterality Date   APPLICATION OF INTRAOPERATIVE CT SCAN N/A 03/27/2022   Procedure: APPLICATION OF INTRAOPERATIVE CT SCAN;  Surgeon: Venetia Night, MD;  Location: [facility] ORS;  Service: Neurosurgery;  Laterality: N/A;   COLONOSCOPY WITH PROPOFOL N/A 03/07/2023   Procedure: COLONOSCOPY WITH PROPOFOL;  Surgeon: Toney Reil, MD;  Location: [facility] ENDOSCOPY;  Service: Gastroenterology;  Laterality: N/A;   HERNIA REPAIR Left    inguinal hernia   L4-5 decompressio     Left L5-S1 discectomy     LUMBAR LAMINECTOMY/DECOMPRESSION MICRODISCECTOMY N/A 07/18/2021   Procedure: L4-5 POSTERIOR SPINAL DECOMPRESSION, LEFT L5-S1  MICRODISCECTOMY;  Surgeon: Venetia Night, MD;  Location: [facility] ORS;  Service: Neurosurgery;  Laterality: N/A;   POLYPECTOMY  03/07/2023   Procedure: POLYPECTOMY INTESTINAL;  Surgeon: Toney Reil, MD;  Location: [facility] ENDOSCOPY;  Service: Gastroenterology;;   SPINE SURGERY  03-28-23   TRANSFORAMINAL LUMBAR INTERBODY FUSION W/ MIS 2  LEVEL N/A 03/27/2022   Procedure: L4-S1 MINIMALLY INVASIVE (MIS) TRANSFORAMINAL LUMBAR INTERBODY FUSION (TLIF);  Surgeon: Venetia Night, MD;  Location: [facility] ORS;  Service: Neurosurgery;  Laterality: N/A;    Family History  Problem Relation Age of Onset   Heart disease Mother     Social History   Socioeconomic History   Marital status: Married    Spouse name: Not on file   Number of children: Not on file   Years of education: Not on file   Highest education level:  Associate degree: academic program  Occupational History   Not on file  Tobacco Use   Smoking status: Never   Smokeless tobacco: Never  Vaping Use   Vaping status: Never Used  Substance and Sexual Activity   Alcohol use: Not Currently    Alcohol/week: 3.0 standard drinks of alcohol    Types: 1 Glasses of wine, 1 Cans of beer, 1 Shots of liquor per week    Comment: occassionally   Drug use: Never   Sexual activity: Yes  Other Topics Concern   Not on file  Social History Narrative   Not on file   Social Drivers of Health   Financial Resource Strain: Patient Declined (07/24/2023)   Overall Financial Resource Strain (CARDIA)    Difficulty of Paying Living Expenses: Patient declined  Food Insecurity: Patient Declined (07/24/2023)   Hunger Vital Sign    Worried About Running Out of Food in the Last Year: Patient declined    Ran Out of Food in the Last Year: Patient declined  Transportation Needs: Patient Declined (07/24/2023)   PRAPARE - Administrator, Civil Service (Medical): Patient declined    Lack of Transportation (Non-Medical): Patient declined  Physical Activity: Unknown (07/24/2023)   Exercise Vital Sign    Days of Exercise per Week: Patient declined    Minutes of Exercise per Session: Not on file  Stress: Patient Declined (07/24/2023)   [employer] of Occupational Health - Occupational Stress Questionnaire    Feeling of Stress : Patient declined  Social Connections: Unknown (07/24/2023)   Social Connection and Isolation Panel [NHANES]    Frequency of Communication with Friends and Family: Patient declined    Frequency of Social Gatherings with Friends and Family: Patient declined    Attends Religious Services: Patient declined    Database administrator or Organizations: No    Attends Engineer, structural: Not on file    Marital Status: Married  Catering manager Violence: Not At Risk (06/11/2022)   Humiliation, Afraid, Rape, and Kick questionnaire     Fear of Current or Ex-Partner: No    Emotionally Abused: No    Physically Abused: No    Sexually Abused: No    Review of Systems  Constitutional:  Negative for chills and fever.  Eyes:  Negative for blurred vision.  Respiratory:  Negative for shortness of breath.   Cardiovascular:  Negative for chest pain.  Neurological:  Negative for headaches.  Psychiatric/Behavioral:  The patient has insomnia.         Objective    BP 130/80 (Cuff Size: Large)   Pulse 83   Temp 98 F (36.7 C) (Oral)   Resp 16   Ht 5\' 10"  (1.778 m)   Wt 204 lb 12.8 oz (92.9 kg)   SpO2 97%   BMI 29.39 kg/m   Physical Exam Constitutional:      Appearance: Normal appearance.  HENT:     Head: Normocephalic and atraumatic.  Mouth/Throat:     Mouth: Mucous membranes are moist.     Pharynx: Oropharynx is clear.  Eyes:     Extraocular Movements: Extraocular movements intact.     Conjunctiva/sclera: Conjunctivae normal.     Pupils: Pupils are equal, round, and reactive to light.  Cardiovascular:     Rate and Rhythm: Normal rate and regular rhythm.  Pulmonary:     Effort: Pulmonary effort is normal.     Breath sounds: Normal breath sounds.  Skin:    General: Skin is warm and dry.  Neurological:     General: No focal deficit present.     Mental Status: He is alert. Mental status is at baseline.  Psychiatric:        Mood and Affect: Mood normal.        Behavior: Behavior normal.         Assessment & Plan:   Hypertension, unspecified type Assessment & Plan: Blood pressure stable here today, no changes made to medications and appropriate refills sent to pharmacy. Labs due.  Orders: -     CBC with Differential/Platelet -     Comprehensive metabolic panel with GFR -     Losartan Potassium; Take 1 tablet (25 mg total) by mouth daily.  Dispense: 90 tablet; Refill: 1  Mixed hyperlipidemia Assessment & Plan: Repeat labs, discussed switching statin to Repatha because LDL not controlled on  last years labs, patient uncertain. Refill statin for now.  Orders: -     Lipid panel -     Atorvastatin Calcium; Take 1 tablet (80 mg total) by mouth every evening.  Dispense: 90 tablet; Refill: 1  History of CVA (cerebrovascular accident) Assessment & Plan: Main priority is to prevent further cardiovascular disease, blood pressure well controlled, recheck fasting lipid panel. On a statin, briefly discussed potentially switching to Repatha.   Orders: -     CBC with Differential/Platelet -     Comprehensive metabolic panel with GFR -     Lipid panel -     Hemoglobin A1c  History of prediabetes Assessment & Plan: Recheck A1c.  Orders: -     Hemoglobin A1c  Vitamin D deficiency Assessment & Plan: Recheck levels, no currently on supplements.   Orders: -     VITAMIN D 25 Hydroxy (Vit-D Deficiency, Fractures)  Vitamin B12 deficiency Assessment & Plan: Last time checked levels were low, will recheck today. Currently taking oral tablets, may need to switch to regular IM vitamin B12.   Orders: -     Vitamin B12      Return in about 6 months (around 01/27/2024).   Margarita Mail, DO

## 2023-07-28 NOTE — Assessment & Plan Note (Signed)
 Last time checked levels were low, will recheck today. Currently taking oral tablets, may need to switch to regular IM vitamin B12.

## 2023-07-28 NOTE — Assessment & Plan Note (Addendum)
 Repeat labs, discussed switching statin to Repatha because LDL not controlled on last years labs, patient uncertain. Refill statin for now.

## 2023-07-28 NOTE — Assessment & Plan Note (Signed)
 Recheck A1c

## 2023-07-28 NOTE — Assessment & Plan Note (Signed)
 Main priority is to prevent further cardiovascular disease, blood pressure well controlled, recheck fasting lipid panel. On a statin, briefly discussed potentially switching to Repatha.

## 2023-07-28 NOTE — Assessment & Plan Note (Signed)
 Recheck levels, no currently on supplements.

## 2023-07-28 NOTE — Assessment & Plan Note (Signed)
 Blood pressure stable here today, no changes made to medications and appropriate refills sent to pharmacy. Labs due.

## 2023-07-29 LAB — CBC WITH DIFFERENTIAL/PLATELET
Absolute Lymphocytes: 2001 {cells}/uL (ref 850–3900)
Absolute Monocytes: 551 {cells}/uL (ref 200–950)
Basophils Absolute: 52 {cells}/uL (ref 0–200)
Basophils Relative: 0.9 %
Eosinophils Absolute: 360 {cells}/uL (ref 15–500)
Eosinophils Relative: 6.2 %
HCT: 49.4 % (ref 38.5–50.0)
Hemoglobin: 16.7 g/dL (ref 13.2–17.1)
MCH: 32.4 pg (ref 27.0–33.0)
MCHC: 33.8 g/dL (ref 32.0–36.0)
MCV: 95.9 fL (ref 80.0–100.0)
MPV: 10.4 fL (ref 7.5–12.5)
Monocytes Relative: 9.5 %
Neutro Abs: 2836 {cells}/uL (ref 1500–7800)
Neutrophils Relative %: 48.9 %
Platelets: 254 10*3/uL (ref 140–400)
RBC: 5.15 10*6/uL (ref 4.20–5.80)
RDW: 12.6 % (ref 11.0–15.0)
Total Lymphocyte: 34.5 %
WBC: 5.8 10*3/uL (ref 3.8–10.8)

## 2023-07-29 LAB — COMPREHENSIVE METABOLIC PANEL WITH GFR
AG Ratio: 1.6 (calc) (ref 1.0–2.5)
ALT: 35 U/L (ref 9–46)
AST: 23 U/L (ref 10–35)
Albumin: 4.7 g/dL (ref 3.6–5.1)
Alkaline phosphatase (APISO): 43 U/L (ref 35–144)
BUN: 17 mg/dL (ref 7–25)
CO2: 25 mmol/L (ref 20–32)
Calcium: 9.5 mg/dL (ref 8.6–10.3)
Chloride: 105 mmol/L (ref 98–110)
Creat: 0.97 mg/dL (ref 0.70–1.30)
Globulin: 3 g/dL (ref 1.9–3.7)
Glucose, Bld: 149 mg/dL — ABNORMAL HIGH (ref 65–99)
Potassium: 5.1 mmol/L (ref 3.5–5.3)
Sodium: 139 mmol/L (ref 135–146)
Total Bilirubin: 1.4 mg/dL — ABNORMAL HIGH (ref 0.2–1.2)
Total Protein: 7.7 g/dL (ref 6.1–8.1)
eGFR: 91 mL/min/{1.73_m2} (ref >=60)

## 2023-07-29 LAB — HEMOGLOBIN A1C
Hgb A1c MFr Bld: 6.6 %{Hb} — ABNORMAL HIGH (ref <5.7)
Mean Plasma Glucose: 143 mg/dL
eAG (mmol/L): 7.9 mmol/L

## 2023-07-29 LAB — LIPID PANEL
Cholesterol: 147 mg/dL (ref <200)
HDL: 48 mg/dL (ref >=40)
LDL Cholesterol (Calc): 79 mg/dL
Non-HDL Cholesterol (Calc): 99 mg/dL (ref <130)
Total CHOL/HDL Ratio: 3.1 (calc) (ref <5.0)
Triglycerides: 113 mg/dL (ref <150)

## 2023-07-29 LAB — VITAMIN B12: Vitamin B-12: 703 pg/mL (ref 200–1100)

## 2023-07-29 LAB — VITAMIN D 25 HYDROXY (VIT D DEFICIENCY, FRACTURES): Vit D, 25-Hydroxy: 35 ng/mL (ref 30–100)

## 2023-07-31 ENCOUNTER — Encounter: Payer: Self-pay | Admitting: Internal Medicine

## 2023-08-01 IMAGING — RF DG LUMBAR SPINE 2-3V
1 series · 4 of 4 positions shown · non-contrast
Comparison: Lumbar spine MRI 06/14/2021

CLINICAL DATA: L4-L5 and L5-S1 microdiscectomy

EXAM:
LUMBAR SPINE - 2-3 VIEW

[Series 1: dg x-ray · 0.20mm/px · 4 of 4 slices shown]
[im 1/4]
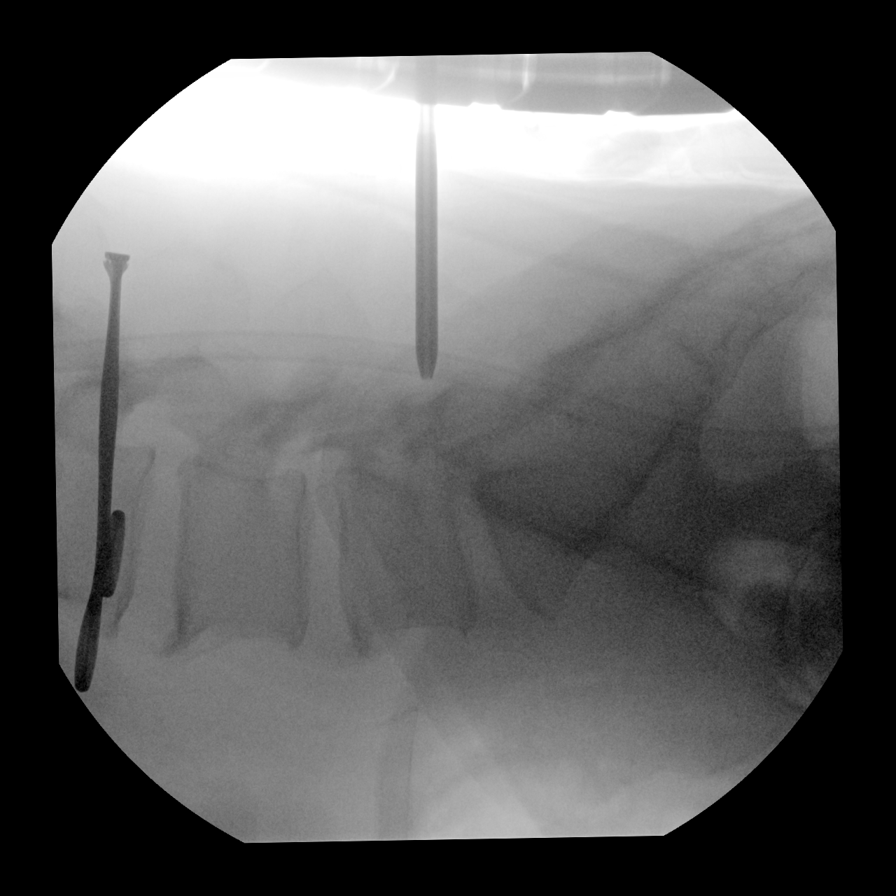
[im 2/4]
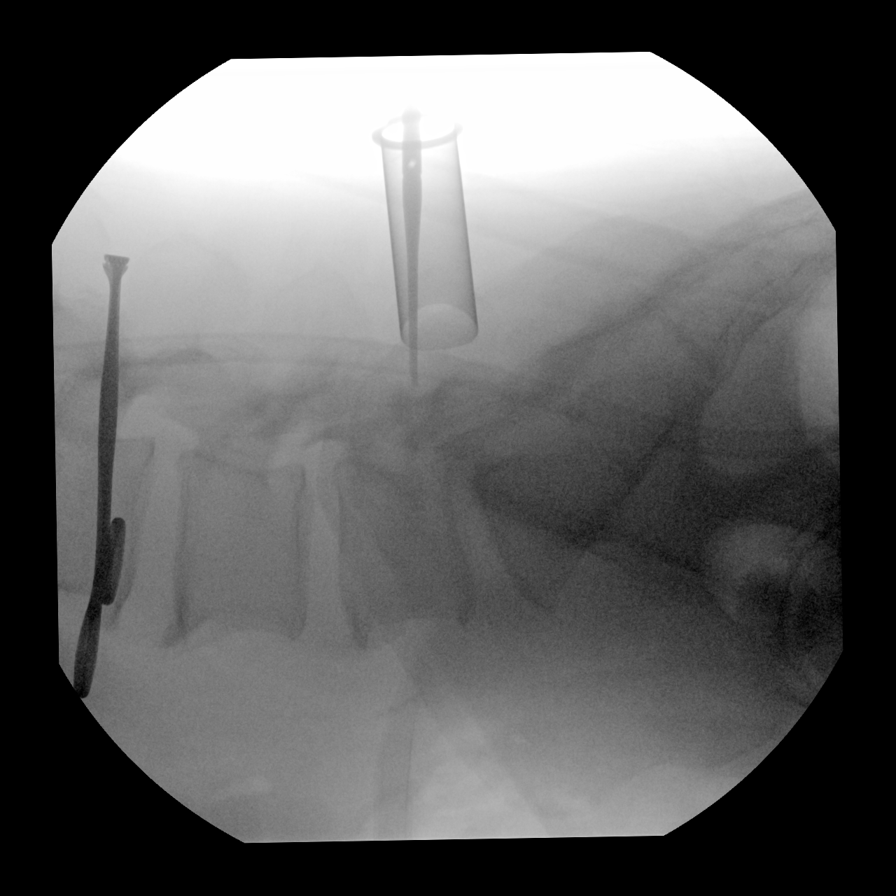
[im 3/4]
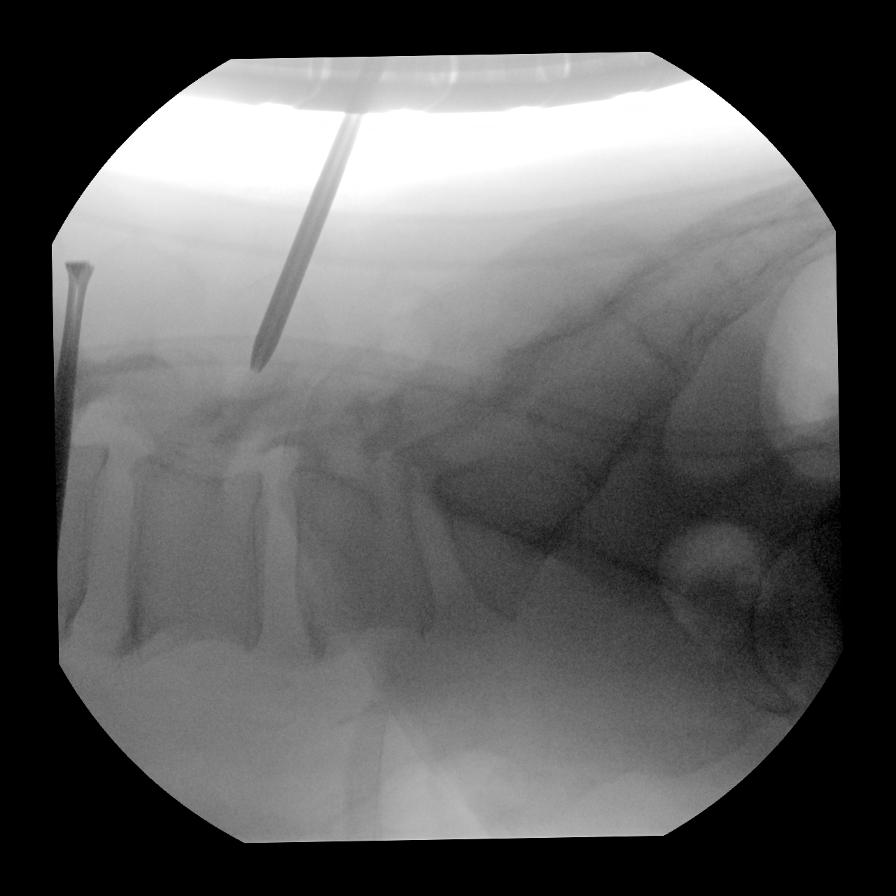
[im 4/4]
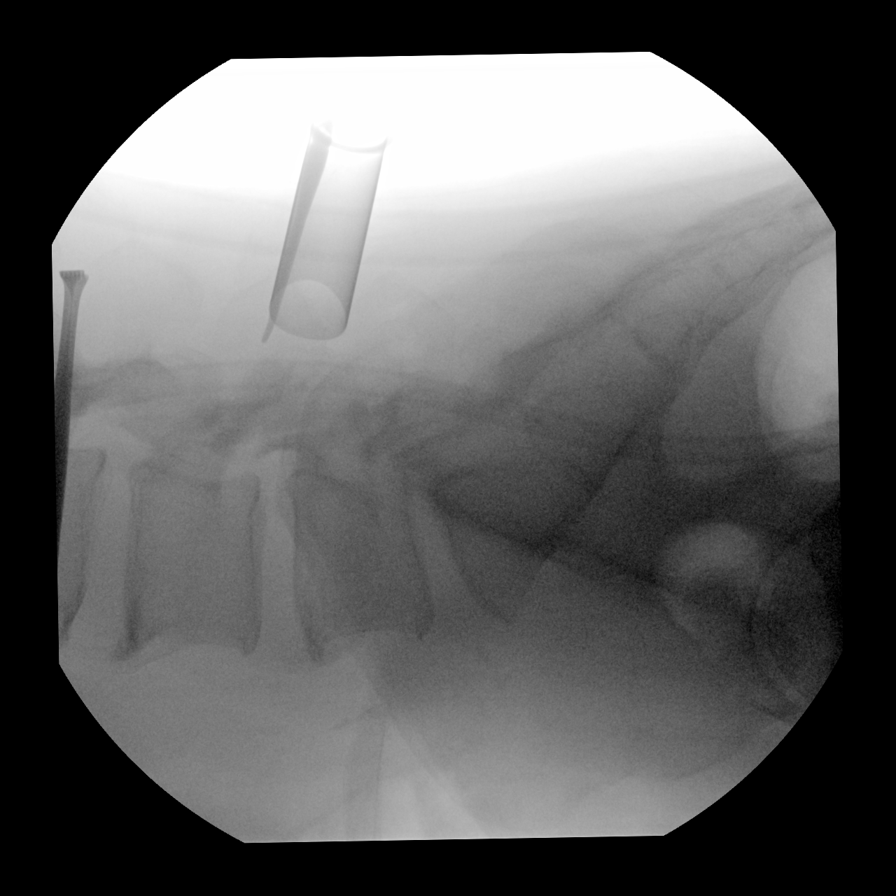

[4 of 4 positions shown; findings below may reference images not displayed]

FINDINGS: 4 lateral C-arm fluoroscopic images were obtained intraoperatively
and submitted for post operative interpretation. On the initial
images, surgical hardware is noted posterior to the L5-S1 disc
space. The next 2 images demonstrate hardware posterior to the L4-L5
disc space. Fluoro time 5 seconds. Please see the performing
provider's procedural report for further detail.
IMPRESSION: Intraoperative localization images for L4-L5 and L5-S1
microdiscectomy.

## 2023-08-04 ENCOUNTER — Other Ambulatory Visit: Payer: Self-pay

## 2023-08-04 ENCOUNTER — Encounter: Payer: Self-pay | Admitting: Internal Medicine

## 2023-08-04 ENCOUNTER — Ambulatory Visit: Admitting: Internal Medicine

## 2023-08-04 VITALS — BP 136/82 | HR 86 | Temp 97.8°F | Resp 18 | Ht 70.0 in

## 2023-08-04 DIAGNOSIS — E1165 Type 2 diabetes mellitus with hyperglycemia: Secondary | ICD-10-CM | POA: Diagnosis not present

## 2023-08-04 MED ORDER — BLOOD GLUCOSE TEST VI STRP: 1.0000 | ORAL_STRIP | Freq: Three times a day (TID) | 0 refills | Status: DC

## 2023-08-04 MED ORDER — BLOOD GLUCOSE MONITORING SUPPL DEVI: 1.0000 | Freq: Three times a day (TID) | 0 refills | Status: DC

## 2023-08-04 MED ORDER — LANCETS MISC. MISC: 1.0000 | Freq: Three times a day (TID) | 0 refills | Status: AC

## 2023-08-04 MED ORDER — LANCET DEVICE MISC: 1.0000 | Freq: Three times a day (TID) | 0 refills | Status: AC

## 2023-08-04 NOTE — Progress Notes (Signed)
 Established Patient Office Visit  Subjective    Patient ID: Erik Wallace, male    DOB: 11-Jul-1965  Age: 58 y.o. MRN: [id number]  CC:  Chief Complaint  Patient presents with   Consult    Discuss lab results and treatment options    HPI Erik Wallace presents to discuss recent lab results.   Diabetes, Type 2, new diagnosis: -Last A1c 6.6% 4/25 -Medications: Nothing -Diet: trying to eat more naturally, eating things like honey from his neighbor's bees and home made bread. Drinking alcohol  -Exercise: none currently due to recent injury but planning to get back into the gym soon -Eye exam: UTD, has a provider will call to schedule -Foot exam: Due -Microalbumin: Due -Statin: yes -PNA vaccine: Not up to date, discuss at follow up -Denies symptoms of hypoglycemia, polyuria, polydipsia, numbness extremities, foot ulcers/trauma.   Hypertension: -Medications: Losartan 25 mg -Patient is compliant with above medications and reports no side effects. -Checking BP at home (average): 135-145/85-90 -Denies any SOB, CP, vision changes, LE edema or symptoms of hypotension  HLD/Hx of CVA: -Medications: Lipitor 80 mg, aspirin  -Patient is compliant with above medications and reports no side effects.  -Last lipid panel: Lipid Panel     Component Value Date/Time   CHOL 147 07/28/2023 0851   TRIG 113 07/28/2023 0851   HDL 48 07/28/2023 0851   CHOLHDL 3.1 07/28/2023 0851   VLDL 66 (H) 06/12/2022 0338   LDLCALC 79 07/28/2023 0851   Hx of Pre-Diabetes: -Highest A1c 5.9% 1 year ago, 5.5 in 2/24  Chronic Bilateral Low Back Pain with Sciatica:  Follows with Dr. Melrose Nakayama since he had L4-S1 TLIF/PSF in December of 2023.  -Last seen 12/24/22  Vitamin D/Vitamin B12 Deficiency: -Took Vitamin D booster supplements last year but not on anything now -Had been doing IM Vitamin B12, now on oral tablets  Health Maintenance: -Blood work due -Colon cancer screening: colonoscopy 11/24, repeat  in 5 years  Outpatient Encounter Medications as of 08/04/2023  Medication Sig   aspirin EC 81 MG tablet Take 1 tablet (81 mg total) by mouth daily. Swallow whole.   atorvastatin (LIPITOR) 80 MG tablet Take 1 tablet (80 mg total) by mouth every evening.   losartan (COZAAR) 25 MG tablet Take 1 tablet (25 mg total) by mouth daily.   No facility-administered encounter medications on file as of 08/04/2023.    Past Medical History:  Diagnosis Date   Anxiety    Back pain    Depression    Elevated blood pressure reading in office without diagnosis of hypertension    due to back pain   Hypertension 06-11-22   Lumbar radiculopathy    Spinal stenosis at L4-L5 level    Stroke [facility]) 06-11-22    Past Surgical History:  Procedure Laterality Date   APPLICATION OF INTRAOPERATIVE CT SCAN N/A 03/27/2022   Procedure: APPLICATION OF INTRAOPERATIVE CT SCAN;  Surgeon: Venetia Night, MD;  Location: [facility] ORS;  Service: Neurosurgery;  Laterality: N/A;   COLONOSCOPY WITH PROPOFOL N/A 03/07/2023   Procedure: COLONOSCOPY WITH PROPOFOL;  Surgeon: Toney Reil, MD;  Location: [facility] ENDOSCOPY;  Service: Gastroenterology;  Laterality: N/A;   HERNIA REPAIR Left    inguinal hernia   L4-5 decompressio     Left L5-S1 discectomy     LUMBAR LAMINECTOMY/DECOMPRESSION MICRODISCECTOMY N/A 07/18/2021   Procedure: L4-5 POSTERIOR SPINAL DECOMPRESSION, LEFT L5-S1  MICRODISCECTOMY;  Surgeon: Venetia Night, MD;  Location: [facility] ORS;  Service: Neurosurgery;  Laterality: N/A;  POLYPECTOMY  03/07/2023   Procedure: POLYPECTOMY INTESTINAL;  Surgeon: Toney Reil, MD;  Location: [facility] ENDOSCOPY;  Service: Gastroenterology;;   SPINE SURGERY  03-28-23   TRANSFORAMINAL LUMBAR INTERBODY FUSION W/ MIS 2 LEVEL N/A 03/27/2022   Procedure: L4-S1 MINIMALLY INVASIVE (MIS) TRANSFORAMINAL LUMBAR INTERBODY FUSION (TLIF);  Surgeon: Venetia Night, MD;  Location: [facility] ORS;  Service: Neurosurgery;  Laterality: N/A;     Family History  Problem Relation Age of Onset   Heart disease Mother     Social History   Socioeconomic History   Marital status: Married    Spouse name: Not on file   Number of children: Not on file   Years of education: Not on file   Highest education level: Associate degree: academic program  Occupational History   Not on file  Tobacco Use   Smoking status: Never   Smokeless tobacco: Never  Vaping Use   Vaping status: Never Used  Substance and Sexual Activity   Alcohol use: Not Currently    Alcohol/week: 3.0 standard drinks of alcohol    Types: 1 Glasses of wine, 1 Cans of beer, 1 Shots of liquor per week    Comment: occassionally   Drug use: Never   Sexual activity: Yes  Other Topics Concern   Not on file  Social History Narrative   Not on file   Social Drivers of Health   Financial Resource Strain: Patient Declined (07/24/2023)   Overall Financial Resource Strain (CARDIA)    Difficulty of Paying Living Expenses: Patient declined  Food Insecurity: Patient Declined (07/24/2023)   Hunger Vital Sign    Worried About Running Out of Food in the Last Year: Patient declined    Ran Out of Food in the Last Year: Patient declined  Transportation Needs: Patient Declined (07/24/2023)   PRAPARE - Administrator, Civil Service (Medical): Patient declined    Lack of Transportation (Non-Medical): Patient declined  Physical Activity: Unknown (07/24/2023)   Exercise Vital Sign    Days of Exercise per Week: Patient declined    Minutes of Exercise per Session: Not on file  Stress: Patient Declined (07/24/2023)   [employer] of Occupational Health - Occupational Stress Questionnaire    Feeling of Stress : Patient declined  Social Connections: Unknown (07/24/2023)   Social Connection and Isolation Panel [NHANES]    Frequency of Communication with Friends and Family: Patient declined    Frequency of Social Gatherings with Friends and Family: Patient declined    Attends  Religious Services: Patient declined    Database administrator or Organizations: No    Attends Engineer, structural: Not on file    Marital Status: Married  Catering manager Violence: Not At Risk (06/11/2022)   Humiliation, Afraid, Rape, and Kick questionnaire    Fear of Current or Ex-Partner: No    Emotionally Abused: No    Physically Abused: No    Sexually Abused: No    Review of Systems  Constitutional:  Negative for chills and fever.  Eyes:  Negative for blurred vision.  Respiratory:  Negative for shortness of breath.   Cardiovascular:  Negative for chest pain.  Neurological:  Negative for headaches.  Psychiatric/Behavioral:  The patient has insomnia.         Objective    BP 136/82 (Cuff Size: Large)   Pulse 86   Temp 97.8 F (36.6 C) (Oral)   Resp 18   Ht 5\' 10"  (1.778 m)   SpO2 98%  BMI 29.39 kg/m   Physical Exam Constitutional:      Appearance: Normal appearance.  HENT:     Head: Normocephalic and atraumatic.  Eyes:     Conjunctiva/sclera: Conjunctivae normal.  Cardiovascular:     Rate and Rhythm: Normal rate and regular rhythm.     Pulses:          Dorsalis pedis pulses are 2+ on the right side and 2+ on the left side.  Pulmonary:     Effort: Pulmonary effort is normal.     Breath sounds: Normal breath sounds.  Musculoskeletal:     Right foot: Normal range of motion. No deformity, bunion, Charcot foot, foot drop or prominent metatarsal heads.     Left foot: Normal range of motion. No deformity, bunion, Charcot foot, foot drop or prominent metatarsal heads.  Feet:     Right foot:     Protective Sensation: 6 sites tested.  6 sites sensed.     Skin integrity: Skin integrity normal.     Toenail Condition: Right toenails are normal.     Left foot:     Protective Sensation: 6 sites tested.  6 sites sensed.     Skin integrity: Skin integrity normal.     Toenail Condition: Left toenails are normal.  Skin:    General: Skin is warm and dry.   Neurological:     General: No focal deficit present.     Mental Status: He is alert. Mental status is at baseline.  Psychiatric:        Mood and Affect: Mood normal.        Behavior: Behavior normal.         Assessment & Plan:   Assessment & Plan Type 2 Diabetes Mellitus New diagnosis with A1c of 6.6% and fasting glucose of 149 mg/dL. Increased risk of complications due to prior stroke. Emphasized blood glucose control to prevent nephropathy and cardiovascular disease. Discussed dietary modifications and self-monitoring preference over pharmacotherapy. - Provide education on dietary modifications to manage blood glucose. - Prescribe a blood glucose monitor for self-monitoring. - Order annual eye exam to screen for diabetic retinopathy. - Perform annual foot exam to check for neuropathy. - Order annual urine test to monitor renal function. - Schedule follow-up in 3 months to recheck A1c.  Hyperlipidemia Well-controlled with Lipitor. Total cholesterol 174 mg/dL, LDL 79 mg/dL, within target range for stroke history. - Continue current Lipitor regimen.  Hypertension Well-controlled with current treatment. Blood pressure 136/82 mmHg, within target range. - Continue current antihypertensive regimen.  General Health Maintenance Discussed regular exercise and healthy lifestyle. Plans to increase physical activity and return to the gym. Addressed stress management. - Encourage regular physical activity and gradual return to the gym. - Advise on stress management techniques.    Return in about 3 months (around 11/03/2023) for follow up on a1c.   Rockney Cid, DO

## 2023-08-04 NOTE — Patient Instructions (Signed)

## 2023-08-05 ENCOUNTER — Encounter: Payer: Self-pay | Admitting: Internal Medicine

## 2023-08-05 LAB — MICROALBUMIN / CREATININE URINE RATIO
Creatinine, Urine: 77 mg/dL (ref 20–320)
Microalb Creat Ratio: 5 mg/g{creat} (ref <30)
Microalb, Ur: 0.4 mg/dL

## 2023-09-02 ENCOUNTER — Other Ambulatory Visit: Payer: Self-pay | Admitting: Internal Medicine

## 2023-09-02 DIAGNOSIS — E1165 Type 2 diabetes mellitus with hyperglycemia: Secondary | ICD-10-CM

## 2023-09-03 NOTE — Telephone Encounter (Signed)
 Requested Prescriptions  Pending Prescriptions Disp Refills   ACCU-CHEK GUIDE TEST test strip [Pharmacy Med Name: ACCU-CHEK GUIDE TEST STRIP] 100 strip 0    Sig: 1 EACH BY IN VITRO ROUTE IN THE MORNING, AT NOON, AND AT BEDTIME. MAY SUBSTITUTE TO ANY MANUFACTURER COVERED BY PATIENT'S INSURANCE.     There is no refill protocol information for this order

## 2023-11-05 ENCOUNTER — Encounter: Payer: Self-pay | Admitting: Internal Medicine

## 2023-11-05 ENCOUNTER — Other Ambulatory Visit: Payer: Self-pay

## 2023-11-05 ENCOUNTER — Ambulatory Visit: Admitting: Internal Medicine

## 2023-11-05 VITALS — BP 132/84 | HR 72 | Temp 97.7°F | Resp 16 | Ht 70.0 in | Wt 196.7 lb

## 2023-11-05 DIAGNOSIS — E1165 Type 2 diabetes mellitus with hyperglycemia: Secondary | ICD-10-CM | POA: Diagnosis not present

## 2023-11-05 LAB — POCT GLYCOSYLATED HEMOGLOBIN (HGB A1C): Hemoglobin A1C: 6 % — AB (ref 4.0–5.6)

## 2023-11-05 NOTE — Progress Notes (Signed)
 Established Patient Office Visit  Subjective    Patient ID: Erik Wallace, male    DOB: [birth year]  Age: 58 y.o. MRN: [id number]  CC:  Chief Complaint  Patient presents with   Medical Management of Chronic Issues    3 month recheck    HPI Erik Wallace presents to recheck A1c.   Discussed the use of AI scribe software for clinical note transcription with the patient, who gave verbal consent to proceed.  History of Present Illness Erik Wallace is a 58 year old male with type 2 diabetes and a history of stroke who presents for follow-up on diabetes management and stroke recovery.  His A1c has improved to 6.0 from 6.6, with blood sugar levels averaging between 80 to 116. He manages diabetes through lifestyle changes, including a diet rich in good fats and proteins, limiting carbohydrates, and intermittent fasting. He experiences night cramps in his calves and a sensation of 'pins' in his feet with occasional burning and tingling. These symptoms may relate to his diabetes or stroke recovery. Residual effects from a stroke include issues with his arm, chest, and side, and frequent dropping of objects with his affected hand. His vitamin B12 levels have been low since the stroke, and he is currently taking oral B12 supplements. He has reduced stress levels due to changes in job responsibilities.   Diabetes, Type 2,: -Last A1c 6.6% 4/25 -Medications: Nothing -Diet: working on decreasing sugar and carbs, increasing healthy fats and proteins.  -Exercise: none currently due to recent injury but planning to get back into the gym soon -Eye exam: UTD -Foot exam:UTD 4/25 -Microalbumin: UTD 4/25 -Statin: yes -PNA vaccine: Not up to date, discuss at follow up -Denies symptoms of hypoglycemia, polyuria, polydipsia, numbness extremities, foot ulcers/trauma.   Hypertension: -Medications: Losartan  25 mg -Patient is compliant with above medications and reports no side  effects. -Checking BP at home (average): 135-145/85-90 -Denies any SOB, CP, vision changes, LE edema or symptoms of hypotension  HLD/Hx of CVA: -Medications: Lipitor 80 mg, aspirin   -Patient is compliant with above medications and reports no side effects.  -Still has some residual symptoms -Last lipid panel: Lipid Panel     Component Value Date/Time   CHOL 147 07/28/2023 0851   TRIG 113 07/28/2023 0851   HDL 48 07/28/2023 0851   CHOLHDL 3.1 07/28/2023 0851   VLDL 66 (H) 06/12/2022 0338   LDLCALC 79 07/28/2023 0851    Chronic Bilateral Low Back Pain with Sciatica:  Follows with Dr. Collene since he had L4-S1 TLIF/PSF in December of 2023.  -Last seen 12/24/22  Vitamin D /Vitamin B12 Deficiency: -Took Vitamin D  booster supplements last year but not on anything now -Had been doing IM Vitamin B12, now on oral tablets  Health Maintenance: -Blood work UTD -Colon cancer screening: colonoscopy 11/24, repeat in 5 years  Outpatient Encounter Medications as of 11/05/2023  Medication Sig   aspirin  EC 81 MG tablet Take 1 tablet (81 mg total) by mouth daily. Swallow whole.   atorvastatin  (LIPITOR) 80 MG tablet Take 1 tablet (80 mg total) by mouth every evening.   losartan  (COZAAR ) 25 MG tablet Take 1 tablet (25 mg total) by mouth daily.   ACCU-CHEK GUIDE TEST test strip 1 EACH BY IN VITRO ROUTE IN THE MORNING, AT NOON, AND AT BEDTIME. MAY SUBSTITUTE TO ANY MANUFACTURER COVERED BY PATIENT'S INSURANCE.   Blood Glucose Monitoring Suppl DEVI 1 each by Does not apply route in the morning, at noon, and  at bedtime. May substitute to any manufacturer covered by patient's insurance.   No facility-administered encounter medications on file as of 11/05/2023.    Past Medical History:  Diagnosis Date   Anxiety    Back pain    Depression    Elevated blood pressure reading in office without diagnosis of hypertension    due to back pain   Hypertension 06-11-22   Lumbar radiculopathy    Spinal  stenosis at L4-L5 level    Stroke [facility]) 06-11-22    Past Surgical History:  Procedure Laterality Date   APPLICATION OF INTRAOPERATIVE CT SCAN N/A 03/27/2022   Procedure: APPLICATION OF INTRAOPERATIVE CT SCAN;  Surgeon: Clois Fret, MD;  Location: [facility] ORS;  Service: Neurosurgery;  Laterality: N/A;   COLONOSCOPY WITH PROPOFOL  N/A 03/07/2023   Procedure: COLONOSCOPY WITH PROPOFOL ;  Surgeon: Unk Corinn Skiff, MD;  Location: [facility] ENDOSCOPY;  Service: Gastroenterology;  Laterality: N/A;   HERNIA REPAIR Left    inguinal hernia   L4-5 decompressio     Left L5-S1 discectomy     LUMBAR LAMINECTOMY/DECOMPRESSION MICRODISCECTOMY N/A 07/18/2021   Procedure: L4-5 POSTERIOR SPINAL DECOMPRESSION, LEFT L5-S1  MICRODISCECTOMY;  Surgeon: Clois Fret, MD;  Location: [facility] ORS;  Service: Neurosurgery;  Laterality: N/A;   POLYPECTOMY  03/07/2023   Procedure: POLYPECTOMY INTESTINAL;  Surgeon: Unk Corinn Skiff, MD;  Location: [facility] ENDOSCOPY;  Service: Gastroenterology;;   SPINE SURGERY  03-28-23   TRANSFORAMINAL LUMBAR INTERBODY FUSION W/ MIS 2 LEVEL N/A 03/27/2022   Procedure: L4-S1 MINIMALLY INVASIVE (MIS) TRANSFORAMINAL LUMBAR INTERBODY FUSION (TLIF);  Surgeon: Clois Fret, MD;  Location: [facility] ORS;  Service: Neurosurgery;  Laterality: N/A;    Family History  Problem Relation Age of Onset   Heart disease Mother     Social History   Socioeconomic History   Marital status: Married    Spouse name: Not on file   Number of children: Not on file   Years of education: Not on file   Highest education level: Associate degree: academic program  Occupational History   Not on file  Tobacco Use   Smoking status: Never   Smokeless tobacco: Never  Vaping Use   Vaping status: Never Used  Substance and Sexual Activity   Alcohol use: Not Currently    Alcohol/week: 3.0 standard drinks of alcohol    Types: 1 Glasses of wine, 1 Cans of beer, 1 Shots of liquor per week    Comment:  occassionally   Drug use: Never   Sexual activity: Yes  Other Topics Concern   Not on file  Social History Narrative   Not on file   Social Drivers of Health   Financial Resource Strain: Patient Declined (11/05/2023)   Overall Financial Resource Strain (CARDIA)    Difficulty of Paying Living Expenses: Patient declined  Food Insecurity: Patient Declined (11/05/2023)   Hunger Vital Sign    Worried About Running Out of Food in the Last Year: Patient declined    Ran Out of Food in the Last Year: Patient declined  Transportation Needs: Patient Declined (11/05/2023)   PRAPARE - Administrator, Civil Service (Medical): Patient declined    Lack of Transportation (Non-Medical): Patient declined  Physical Activity: Insufficiently Active (11/05/2023)   Exercise Vital Sign    Days of Exercise per Week: 3 days    Minutes of Exercise per Session: 20 min  Stress: Stress Concern Present (11/05/2023)   [employer] of Occupational Health - Occupational Stress Questionnaire    Feeling of Stress:  Rather much  Social Connections: Unknown (11/05/2023)   Social Connection and Isolation Panel    Frequency of Communication with Friends and Family: Once a week    Frequency of Social Gatherings with Friends and Family: Patient declined    Attends Religious Services: Patient declined    Database administrator or Organizations: No    Attends Engineer, structural: Not on file    Marital Status: Married  Catering manager Violence: Not At Risk (06/11/2022)   Humiliation, Afraid, Rape, and Kick questionnaire    Fear of Current or Ex-Partner: No    Emotionally Abused: No    Physically Abused: No    Sexually Abused: No    Review of Systems  All other systems reviewed and are negative.       Objective    BP 132/84 (Cuff Size: Large)   Pulse 72   Temp 97.7 F (36.5 C) (Oral)   Resp 16   Ht 5' 10 (1.778 m)   Wt 196 lb 11.2 oz (89.2 kg)   SpO2 99%   BMI 28.22 kg/m    Physical Exam Constitutional:      Appearance: Normal appearance.  HENT:     Head: Normocephalic and atraumatic.  Eyes:     Conjunctiva/sclera: Conjunctivae normal.  Cardiovascular:     Rate and Rhythm: Normal rate and regular rhythm.  Pulmonary:     Effort: Pulmonary effort is normal.     Breath sounds: Normal breath sounds.  Skin:    General: Skin is warm and dry.  Neurological:     General: No focal deficit present.     Mental Status: He is alert. Mental status is at baseline.  Psychiatric:        Mood and Affect: Mood normal.        Behavior: Behavior normal.         Assessment & Plan:   Assessment & Plan Type 2 Diabetes Mellitus Well-controlled with lifestyle modifications. A1c improved to 6.0%. Fasting glucose consistently below 120 mg/dL. Discussed potential complications and importance of regular screenings. - Continue lifestyle modifications. - Recheck A1c in six months.  Hx of CVA Stroke with residual deficits. Ongoing recovery acknowledged. Discussed variable recovery from stroke-related deficits. - Monitor neurological symptoms. - Continue current management and lifestyle modifications.  Vitamin B12 Deficiency Chronic deficiency, possibly contributing to neurological symptoms. Currently on oral supplementation. Discussed potential causes and long-term development of deficiency. - Continue oral vitamin B12 supplementation. - Recheck vitamin B12 levels in six months.  General Health Maintenance Engaging in lifestyle modifications with reported reduced stress and improved dietary habits. Discussed benefits of intermittent fasting. - Encourage continuation of current lifestyle modifications.  - POCT HgB A1C   Return in about 6 months (around 05/07/2024).   Sharyle Fischer, DO

## 2024-01-27 ENCOUNTER — Encounter: Payer: Self-pay | Admitting: Internal Medicine

## 2024-01-27 ENCOUNTER — Other Ambulatory Visit: Payer: Self-pay

## 2024-01-27 ENCOUNTER — Ambulatory Visit: Admitting: Internal Medicine

## 2024-01-27 VITALS — BP 132/74 | HR 81 | Temp 97.9°F | Resp 16 | Ht 70.0 in | Wt 194.7 lb

## 2024-01-27 DIAGNOSIS — I10 Essential (primary) hypertension: Secondary | ICD-10-CM

## 2024-01-27 DIAGNOSIS — E1165 Type 2 diabetes mellitus with hyperglycemia: Secondary | ICD-10-CM

## 2024-01-27 DIAGNOSIS — E782 Mixed hyperlipidemia: Secondary | ICD-10-CM

## 2024-01-27 MED ORDER — ATORVASTATIN CALCIUM 80 MG PO TABS: 80.0000 mg | ORAL_TABLET | Freq: Every evening | ORAL | 1 refills | Status: AC

## 2024-01-27 MED ORDER — LOSARTAN POTASSIUM 25 MG PO TABS: 25.0000 mg | ORAL_TABLET | Freq: Every day | ORAL | 1 refills | Status: AC

## 2024-01-27 NOTE — Progress Notes (Signed)
 Established Patient Office Visit  Subjective    Patient ID: Erik Wallace, male    DOB: 1965-06-28  Age: 58 y.o. MRN: [id number]  CC:  Chief Complaint  Patient presents with   Medical Management of Chronic Issues    6 month recheck    HPI Erik Wallace presents to recheck A1c.   Discussed the use of AI scribe software for clinical note transcription with the patient, who gave verbal consent to proceed.  History of Present Illness   Erik Wallace is a 58 year old male who presents for medication refills.  He requires refills for Lipitor and losartan . His blood pressure is 132/74 mmHg. Cholesterol levels have improved since starting medication. Diabetes is well-managed with improved blood sugar levels through fasting and a diet of clean fats and proteins. He is not due for an A1c test. He has a history of stroke but reports staying healthy and rarely getting sick. He declines the pneumonia vaccine, citing infrequent illness.   Diabetes, Type 2,: -Last A1c 6.0% 7/25 -Medications: Nothing -Diet: working on decreasing sugar and carbs, increasing healthy fats and proteins.  -Exercise: none currently due to recent injury but planning to get back into the gym soon -Eye exam: UTD -Foot exam:UTD 4/25 -Microalbumin: UTD 4/25 -Statin: yes -PNA vaccine: Declines -Denies symptoms of hypoglycemia, polyuria, polydipsia, numbness extremities, foot ulcers/trauma.   Hypertension: -Medications: Losartan  25 mg -Patient is compliant with above medications and reports no side effects. -Checking BP at home (average): 135-145/85-90 -Denies any SOB, CP, vision changes, LE edema or symptoms of hypotension  HLD/Hx of CVA: -Medications: Lipitor 80 mg, aspirin   -Patient is compliant with above medications and reports no side effects.  -Still has some residual symptoms -Last lipid panel: Lipid Panel     Component Value Date/Time   CHOL 147 07/28/2023 0851   TRIG 113 07/28/2023  0851   HDL 48 07/28/2023 0851   CHOLHDL 3.1 07/28/2023 0851   VLDL 66 (H) 06/12/2022 0338   LDLCALC 79 07/28/2023 0851    Chronic Bilateral Low Back Pain with Sciatica:  Follows with Dr. Collene since he had L4-S1 TLIF/PSF in December of 2023.   Vitamin D /Vitamin B12 Deficiency: -Took Vitamin D  booster supplements last year but not on anything now -Had been doing IM Vitamin B12, now on oral tablets  Health Maintenance: -Blood work UTD -Colon cancer screening: colonoscopy 11/24, repeat in 5 years -Declines all vaccines  Outpatient Encounter Medications as of 01/27/2024  Medication Sig   aspirin  EC 81 MG tablet Take 1 tablet (81 mg total) by mouth daily. Swallow whole.   atorvastatin  (LIPITOR) 80 MG tablet Take 1 tablet (80 mg total) by mouth every evening.   losartan  (COZAAR ) 25 MG tablet Take 1 tablet (25 mg total) by mouth daily.   ACCU-CHEK GUIDE TEST test strip 1 EACH BY IN VITRO ROUTE IN THE MORNING, AT NOON, AND AT BEDTIME. MAY SUBSTITUTE TO ANY MANUFACTURER COVERED BY PATIENT'S INSURANCE.   No facility-administered encounter medications on file as of 01/27/2024.    Past Medical History:  Diagnosis Date   Anxiety    Back pain    Depression    Elevated blood pressure reading in office without diagnosis of hypertension    due to back pain   Hypertension 06-11-22   Lumbar radiculopathy    Spinal stenosis at L4-L5 level    Stroke [facility]) 06-11-22    Past Surgical History:  Procedure Laterality Date   APPLICATION OF INTRAOPERATIVE  CT SCAN N/A 03/27/2022   Procedure: APPLICATION OF INTRAOPERATIVE CT SCAN;  Surgeon: Clois Fret, MD;  Location: [facility] ORS;  Service: Neurosurgery;  Laterality: N/A;   COLONOSCOPY WITH PROPOFOL  N/A 03/07/2023   Procedure: COLONOSCOPY WITH PROPOFOL ;  Surgeon: Unk Corinn Skiff, MD;  Location: [facility] ENDOSCOPY;  Service: Gastroenterology;  Laterality: N/A;   HERNIA REPAIR Left    inguinal hernia   L4-5 decompressio     Left L5-S1  discectomy     LUMBAR LAMINECTOMY/DECOMPRESSION MICRODISCECTOMY N/A 07/18/2021   Procedure: L4-5 POSTERIOR SPINAL DECOMPRESSION, LEFT L5-S1  MICRODISCECTOMY;  Surgeon: Clois Fret, MD;  Location: [facility] ORS;  Service: Neurosurgery;  Laterality: N/A;   POLYPECTOMY  03/07/2023   Procedure: POLYPECTOMY INTESTINAL;  Surgeon: Unk Corinn Skiff, MD;  Location: [facility] ENDOSCOPY;  Service: Gastroenterology;;   SPINE SURGERY  03-28-23   TRANSFORAMINAL LUMBAR INTERBODY FUSION W/ MIS 2 LEVEL N/A 03/27/2022   Procedure: L4-S1 MINIMALLY INVASIVE (MIS) TRANSFORAMINAL LUMBAR INTERBODY FUSION (TLIF);  Surgeon: Clois Fret, MD;  Location: [facility] ORS;  Service: Neurosurgery;  Laterality: N/A;    Family History  Problem Relation Age of Onset   Heart disease Mother     Social History   Socioeconomic History   Marital status: Married    Spouse name: Not on file   Number of children: Not on file   Years of education: Not on file   Highest education level: Associate degree: academic program  Occupational History   Not on file  Tobacco Use   Smoking status: Never   Smokeless tobacco: Never  Vaping Use   Vaping status: Never Used  Substance and Sexual Activity   Alcohol use: Not Currently    Alcohol/week: 3.0 standard drinks of alcohol    Types: 1 Glasses of wine, 1 Cans of beer, 1 Shots of liquor per week    Comment: occassionally   Drug use: Never   Sexual activity: Yes  Other Topics Concern   Not on file  Social History Narrative   Not on file   Social Drivers of Health   Financial Resource Strain: Patient Declined (11/05/2023)   Overall Financial Resource Strain (CARDIA)    Difficulty of Paying Living Expenses: Patient declined  Food Insecurity: Patient Declined (11/05/2023)   Hunger Vital Sign    Worried About Running Out of Food in the Last Year: Patient declined    Ran Out of Food in the Last Year: Patient declined  Transportation Needs: Patient Declined (11/05/2023)   PRAPARE  - Administrator, Civil Service (Medical): Patient declined    Lack of Transportation (Non-Medical): Patient declined  Physical Activity: Insufficiently Active (11/05/2023)   Exercise Vital Sign    Days of Exercise per Week: 3 days    Minutes of Exercise per Session: 20 min  Stress: Stress Concern Present (11/05/2023)   [employer] of Occupational Health - Occupational Stress Questionnaire    Feeling of Stress: Rather much  Social Connections: Unknown (11/05/2023)   Social Connection and Isolation Panel    Frequency of Communication with Friends and Family: Once a week    Frequency of Social Gatherings with Friends and Family: Patient declined    Attends Religious Services: Patient declined    Database administrator or Organizations: No    Attends Banker Meetings: Not on file    Marital Status: Married  Intimate Partner Violence: Not At Risk (06/11/2022)   Humiliation, Afraid, Rape, and Kick questionnaire    Fear of Current or Ex-Partner: No  Emotionally Abused: No    Physically Abused: No    Sexually Abused: No    Review of Systems  All other systems reviewed and are negative.       Objective    BP 132/74 (Cuff Size: Large)   Pulse 81   Temp 97.9 F (36.6 C) (Oral)   Resp 16   Ht 5' 10 (1.778 m)   Wt 194 lb 11.2 oz (88.3 kg)   SpO2 99%   BMI 27.94 kg/m   Physical Exam Constitutional:      Appearance: Normal appearance.  HENT:     Head: Normocephalic and atraumatic.  Eyes:     Conjunctiva/sclera: Conjunctivae normal.  Cardiovascular:     Rate and Rhythm: Normal rate and regular rhythm.  Pulmonary:     Effort: Pulmonary effort is normal.     Breath sounds: Normal breath sounds.  Skin:    General: Skin is warm and dry.  Neurological:     General: No focal deficit present.     Mental Status: He is alert. Mental status is at baseline.  Psychiatric:        Mood and Affect: Mood normal.        Behavior: Behavior normal.          Assessment & Plan:   Assessment & Plan Type 2 diabetes mellitus Diabetes well-controlled with dietary management. A1c testing not due. - Continue current diabetes management plan. - Schedule follow-up in April for lab work.  Essential hypertension Blood pressure well-controlled. - Refill losartan  prescription.  Hyperlipidemia Hyperlipidemia well-managed with medication. Cholesterol levels improved. - Refill Lipitor prescription.  General Health Maintenance Declined pneumonia vaccine despite increased risk.  - atorvastatin  (LIPITOR) 80 MG tablet; Take 1 tablet (80 mg total) by mouth every evening.  Dispense: 90 tablet; Refill: 1 - losartan  (COZAAR ) 25 MG tablet; Take 1 tablet (25 mg total) by mouth daily.  Dispense: 90 tablet; Refill: 1   Return in about 6 months (around 07/27/2024) for can cancel January appointment .   Sharyle Fischer, DO

## 2024-05-07 ENCOUNTER — Ambulatory Visit: Admitting: Internal Medicine

## 2024-07-23 ENCOUNTER — Ambulatory Visit: Admitting: Internal Medicine

## 2024-07-27 ENCOUNTER — Ambulatory Visit: Admitting: Internal Medicine
